# Patient Record
Sex: Male | Born: 1990 | Race: Black or African American | Hispanic: No | Marital: Single | Smoking: Current every day smoker
Health system: Southern US, Community
[De-identification: ages and names within clinical notes are randomized; demographics above are authoritative.]

## PROBLEM LIST (undated history)

## (undated) DIAGNOSIS — J45909 Unspecified asthma, uncomplicated: Secondary | ICD-10-CM

## (undated) HISTORY — PX: THUMB FUSION: SUR636

---

## 2015-02-08 ENCOUNTER — Emergency Department (HOSPITAL_COMMUNITY)
Admission: EM | Admit: 2015-02-08 | Discharge: 2015-02-09 | Disposition: A | Discharge status: Home / Self Care | Payer: Self-pay | Attending: Emergency Medicine | Admitting: Emergency Medicine

## 2015-02-08 ENCOUNTER — Encounter (HOSPITAL_COMMUNITY): Payer: Self-pay | Admitting: Emergency Medicine

## 2015-02-08 ENCOUNTER — Emergency Department (HOSPITAL_COMMUNITY): Payer: Self-pay

## 2015-02-08 DIAGNOSIS — M79672 Pain in left foot: Secondary | ICD-10-CM

## 2015-02-08 DIAGNOSIS — S93602A Unspecified sprain of left foot, initial encounter: Secondary | ICD-10-CM | POA: Insufficient documentation

## 2015-02-08 DIAGNOSIS — Y998 Other external cause status: Secondary | ICD-10-CM | POA: Insufficient documentation

## 2015-02-08 DIAGNOSIS — J45909 Unspecified asthma, uncomplicated: Secondary | ICD-10-CM | POA: Insufficient documentation

## 2015-02-08 DIAGNOSIS — Y92838 Other recreation area as the place of occurrence of the external cause: Secondary | ICD-10-CM | POA: Insufficient documentation

## 2015-02-08 DIAGNOSIS — W500XXA Accidental hit or strike by another person, initial encounter: Secondary | ICD-10-CM | POA: Insufficient documentation

## 2015-02-08 DIAGNOSIS — Y9367 Activity, basketball: Secondary | ICD-10-CM | POA: Insufficient documentation

## 2015-02-08 HISTORY — DX: Unspecified asthma, uncomplicated: J45.909

## 2015-02-08 MED ORDER — TRAMADOL HCL 50 MG PO TABS
50.0000 mg | ORAL_TABLET | Freq: Once | ORAL | Status: AC
  Administered 2015-02-08: 50 mg via ORAL
  Filled 2015-02-08: qty 1

## 2015-02-08 NOTE — ED Notes (Signed)
Pt states a few months ago he injured his ankle playing basketball and wasn't seen for it. States today he was playing basketball and re-injured the area. Mild swelling with darker coloration to a small area of skin located on the inner aspect of patient's ankle observed. No obvious deformities

## 2015-02-08 NOTE — ED Provider Notes (Signed)
CSN: [id number]     Arrival date & time 02/08/15  2129 History  This chart was scribed for Antony MaduraKelly Jenniferann Stuckert, PA-C working with Layla MawKristen N Ward, DO by Elveria Risingimelie Horne, ED Scribe. This patient was seen in room WTR8/WTR8 and the patient's care was started at 10:43 PM.   No chief complaint on file.  The history is provided by the patient. No language interpreter was used.   HPI Comments: Joshua Hartman is a 24 y.o. male who presents to the Emergency Department after left ankle injury incurred this afternoon while playing basketball. Patient reports inverting his ankle after being being hit by another player. Patient reports throbbing pain to the medial aspect of foot/arch especially with ambulation but reports the pain has improved since the injury this afternoon. Patient however reports limping due to pain severity. No pain medication at home. Patient shares a left ankle injury 3-4 months ago via similar mechanism. Patient reports pain following the injuring that had resolved prior to reinjury today. Patient reports treating this ankle with ankle braces and bandaging.  Patient reports history of previous injuries to ankle including fracture and and tendonitis.   Past Medical History  Diagnosis Date  . Asthma    Past Surgical History  Procedure Laterality Date  . Thumb fusion     History reviewed. No pertinent family history. History  Substance Use Topics  . Smoking status: Not on file  . Smokeless tobacco: Not on file  . Alcohol Use: Not on file    Review of Systems  Constitutional: Negative for fever.  Musculoskeletal:       Left foot pain  Skin: Negative for color change and wound.  Neurological: Negative for weakness and numbness.  All other systems reviewed and are negative.   Allergies  Augmentin  Home Medications   Prior to Admission medications   Medication Sig Start Date End Date Taking? Authorizing Provider  meloxicam (MOBIC) 7.5 MG tablet Take 2 tablets (15 mg total) by mouth  daily. 02/09/15   Antony MaduraKelly Seanne Chirico, PA-C   Triage Vitals: BP 111/72 mmHg  Pulse 99  Temp(Src) 98.6 F (37 C) (Oral)  Resp 18  SpO2 100%  Physical Exam  Constitutional: He is oriented to person, place, and time. He appears well-developed and well-nourished. No distress.  HENT:  Head: Normocephalic and atraumatic.  Eyes: Conjunctivae and EOM are normal. No scleral icterus.  Neck: Normal range of motion.  Cardiovascular: Normal rate, regular rhythm and intact distal pulses.   DP and PT pulses 2+ in the left lower extremity  Pulmonary/Chest: Effort normal. No respiratory distress.  Musculoskeletal: Normal range of motion. He exhibits tenderness.       Left foot: There is tenderness. There is normal range of motion, no swelling, normal capillary refill, no crepitus and no deformity.       Feet:  Neurological: He is alert and oriented to person, place, and time. He exhibits normal muscle tone. Coordination normal.  Sensation to light touch intact. Patient able to wiggle all toes.  Skin: Skin is warm and dry. No rash noted. He is not diaphoretic. No erythema. No pallor.  Psychiatric: He has a normal mood and affect. His behavior is normal.  Nursing note and vitals reviewed.   ED Course  Procedures (including critical care time)  COORDINATION OF CARE: 10:51 PM- Discussed treatment plan with patient at bedside and patient agreed to plan.   Labs Review Labs Reviewed - No data to display  Imaging Review Dg Ankle Complete Left  02/08/2015   CLINICAL DATA:  Fall with ankle injury.  Initial encounter.  EXAM: LEFT ANKLE COMPLETE - 3+ VIEW  COMPARISON:  None.  FINDINGS: There is no evidence of fracture, dislocation, or joint effusion.  There is a lucency within the navicular bone which is better evaluated on dedicated foot imaging.  IMPRESSION: 1. No acute osseous findings. 2.  Navicular lucency, see foot radiography report.   Electronically Signed   By: Marnee Spring M.D.   On: 02/08/2015 23:04    Dg Foot Complete Left  02/08/2015   CLINICAL DATA:  Foot injury playing basketball, now with lateral sided ankle and foot pain.  EXAM: LEFT FOOT - COMPLETE 3+ VIEW  COMPARISON:  Left ankle radiographs-earlier same day  FINDINGS: No fracture or dislocation. Pes planus deformity is suspected on this nonweightbearing radiograph. Joint spaces are preserved. No erosions. Apparent lucency within the navicular bone may represent an intra osseous lipoma. No associated periostitis. Regional soft tissues appear normal. No radiopaque foreign body.  IMPRESSION: No acute findings.   Electronically Signed   By: Simonne Come M.D.   On: 02/08/2015 23:35     EKG Interpretation None      MDM   Final diagnoses:  Foot pain, left  Foot sprain, left, initial encounter    24 year old male presents to the emergency department for further evaluation of left foot pain. He states that he injured his foot while playing basketball. He is neurovascularly intact. Tenderness noted to the medial arch of the left foot. No crepitus or deformity. X-ray negative for fracture, dislocation, or bony deformity. Will manage with ASO ankle and crutches for WBAT. RICE and NSAIDs advised and return precautions given. Patient also provided referral to orthopedics should he desire outpatient follow-up. Patient agreeable to plan with no unaddressed concerns. Patient discharged in good condition.  I personally performed the services described in this documentation, which was scribed in my presence. The recorded information has been reviewed and is accurate.   Filed Vitals:   02/08/15 2221 02/09/15 0037  BP: 111/72 127/82  Pulse: 99 85  Temp: 98.6 F (37 C)   TempSrc: Oral   Resp: 18 18  SpO2: 100% 100%      Antony Madura, PA-C 02/09/15 0052  Layla Maw Ward, DO 02/09/15 1610

## 2015-02-09 MED ORDER — MELOXICAM 7.5 MG PO TABS: 15.0000 mg | ORAL_TABLET | Freq: Every day | ORAL | Status: DC

## 2015-02-09 NOTE — Discharge Instructions (Signed)
Foot Sprain The muscles and cord like structures which attach muscle to bone (tendons) that surround the feet are made up of units. A foot sprain can occur at the weakest spot in any of these units. This condition is most often caused by injury to or overuse of the foot, as from playing contact sports, or aggravating a previous injury, or from poor conditioning, or obesity. SYMPTOMS  Pain with movement of the foot.  Tenderness and swelling at the injury site.  Loss of strength is present in moderate or severe sprains. THE THREE GRADES OR SEVERITY OF FOOT SPRAIN ARE:  Mild (Grade I): Slightly pulled muscle without tearing of muscle or tendon fibers or loss of strength.  Moderate (Grade II): Tearing of fibers in a muscle, tendon, or at the attachment to bone, with small decrease in strength.  Severe (Grade III): Rupture of the muscle-tendon-bone attachment, with separation of fibers. Severe sprain requires surgical repair. Often repeating (chronic) sprains are caused by overuse. Sudden (acute) sprains are caused by direct injury or over-use. DIAGNOSIS  Diagnosis of this condition is usually by your own observation. If problems continue, a caregiver may be required for further evaluation and treatment. X-rays may be required to make sure there are not breaks in the bones (fractures) present. Continued problems may require physical therapy for treatment. PREVENTION  Use strength and conditioning exercises appropriate for your sport.  Warm up properly prior to working out.  Use athletic shoes that are made for the sport you are participating in.  Allow adequate time for healing. Early return to activities makes repeat injury more likely, and can lead to an unstable arthritic foot that can result in prolonged disability. Mild sprains generally heal in 3 to 10 days, with moderate and severe sprains taking 2 to 10 weeks. Your caregiver can help you determine the proper time required for  healing. HOME CARE INSTRUCTIONS   Apply ice to the injury for 15-20 minutes, 03-04 times per day. Put the ice in a plastic bag and place a towel between the bag of ice and your skin.  An elastic wrap (like an Ace bandage) may be used to keep swelling down.  Keep foot above the level of the heart, or at least raised on a footstool, when swelling and pain are present.  Try to avoid use other than gentle range of motion while the foot is painful. Do not resume use until instructed by your caregiver. Then begin use gradually, not increasing use to the point of pain. If pain does develop, decrease use and continue the above measures, gradually increasing activities that do not cause discomfort, until you gradually achieve normal use.  Use crutches if and as instructed, and for the length of time instructed.  Keep injured foot and ankle wrapped between treatments.  Massage foot and ankle for comfort and to keep swelling down. Massage from the toes up towards the knee.  Only take over-the-counter or prescription medicines for pain, discomfort, or fever as directed by your caregiver. SEEK IMMEDIATE MEDICAL CARE IF:   Your pain and swelling increase, or pain is not controlled with medications.  You have loss of feeling in your foot or your foot turns cold or blue.  You develop new, unexplained symptoms, or an increase of the symptoms that brought you to your caregiver. MAKE SURE YOU:   Understand these instructions.  Will watch your condition.  Will get help right away if you are not doing well or get worse. Document Released:   02/21/2002 Document Revised: 11/24/2011 Document Reviewed: 04/20/2008 ExitCare Patient Information 2015 [employer]. This information is not intended to replace advice given to you by your health care provider. Make sure you discuss any questions you have with your health care provider.  RICE: Routine Care for Injuries The routine care of many injuries includes  Rest, Ice, Compression, and Elevation (RICE). HOME CARE INSTRUCTIONS  Rest is needed to allow your body to heal. Routine activities can usually be resumed when comfortable. Injured tendons and bones can take up to 6 weeks to heal. Tendons are the cord-like structures that attach muscle to bone.  Ice following an injury helps keep the swelling down and reduces pain.  Put ice in a plastic bag.  Place a towel between your skin and the bag.  Leave the ice on for 15-20 minutes, 3-4 times a day, or as directed by your health care provider. Do this while awake, for the first 24 to 48 hours. After that, continue as directed by your caregiver.  Compression helps keep swelling down. It also gives support and helps with discomfort. If an elastic bandage has been applied, it should be removed and reapplied every 3 to 4 hours. It should not be applied tightly, but firmly enough to keep swelling down. Watch fingers or toes for swelling, bluish discoloration, coldness, numbness, or excessive pain. If any of these problems occur, remove the bandage and reapply loosely. Contact your caregiver if these problems continue.  Elevation helps reduce swelling and decreases pain. With extremities, such as the arms, hands, legs, and feet, the injured area should be placed near or above the level of the heart, if possible. SEEK IMMEDIATE MEDICAL CARE IF:  You have persistent pain and swelling.  You develop redness, numbness, or unexpected weakness.  Your symptoms are getting worse rather than improving after several days. These symptoms may indicate that further evaluation or further X-rays are needed. Sometimes, X-rays may not show a small broken bone (fracture) until 1 week or 10 days later. Make a follow-up appointment with your caregiver. Ask when your X-ray results will be ready. Make sure you get your X-ray results. Document Released: 12/14/2000 Document Revised: 09/06/2013 Document Reviewed:  01/31/2011 ExitCare Patient Information 2015 [employer]. This information is not intended to replace advice given to you by your health care provider. Make sure you discuss any questions you have with your health care provider.  

## 2015-11-03 ENCOUNTER — Emergency Department (HOSPITAL_COMMUNITY)
Admission: EM | Admit: 2015-11-03 | Discharge: 2015-11-03 | Disposition: A | Discharge status: Home / Self Care | Payer: Self-pay | Attending: Emergency Medicine | Admitting: Emergency Medicine

## 2015-11-03 ENCOUNTER — Encounter (HOSPITAL_COMMUNITY): Payer: Self-pay | Admitting: *Deleted

## 2015-11-03 DIAGNOSIS — F17210 Nicotine dependence, cigarettes, uncomplicated: Secondary | ICD-10-CM | POA: Insufficient documentation

## 2015-11-03 DIAGNOSIS — J45909 Unspecified asthma, uncomplicated: Secondary | ICD-10-CM | POA: Insufficient documentation

## 2015-11-03 DIAGNOSIS — Z791 Long term (current) use of non-steroidal anti-inflammatories (NSAID): Secondary | ICD-10-CM | POA: Insufficient documentation

## 2015-11-03 DIAGNOSIS — Z202 Contact with and (suspected) exposure to infections with a predominantly sexual mode of transmission: Secondary | ICD-10-CM | POA: Insufficient documentation

## 2015-11-03 MED ORDER — LIDOCAINE HCL (PF) 1 % IJ SOLN
0.9000 mL | Freq: Once | INTRAMUSCULAR | Status: AC
  Administered 2015-11-03: 0.9 mL
  Filled 2015-11-03: qty 5

## 2015-11-03 MED ORDER — AZITHROMYCIN 250 MG PO TABS
1000.0000 mg | ORAL_TABLET | Freq: Once | ORAL | Status: AC
  Administered 2015-11-03: 1000 mg via ORAL
  Filled 2015-11-03: qty 4

## 2015-11-03 MED ORDER — CEFTRIAXONE SODIUM 250 MG IJ SOLR
250.0000 mg | INTRAMUSCULAR | Status: DC
  Administered 2015-11-03: 250 mg via INTRAMUSCULAR
  Filled 2015-11-03: qty 250

## 2015-11-03 NOTE — ED Provider Notes (Signed)
CSN: [id number]     Arrival date & time 11/03/15  1402 History   First MD Initiated Contact with Patient 11/03/15 1522     Chief Complaint  Patient presents with  . Exposure to STD   HPI   25 year old male presents today with STD exposure. Patient reports that his sexual partner was recently diagnosed with chlamydia. Patient denies any penile discharge, swelling, rash, pain to the testicles, abdominal pain, nausea, vomiting, fever. Patient has no other complaints other than potential exposure to STD.  Past Medical History  Diagnosis Date  . Asthma    Past Surgical History  Procedure Laterality Date  . Thumb fusion     History reviewed. No pertinent family history. Social History  Substance Use Topics  . Smoking status: Current Every Day Smoker    Types: Cigarettes  . Smokeless tobacco: None  . Alcohol Use: No    Review of Systems  All other systems reviewed and are negative.   Allergies  Augmentin  Home Medications   Prior to Admission medications   Medication Sig Start Date End Date Taking? Authorizing Provider  meloxicam (MOBIC) 7.5 MG tablet Take 2 tablets (15 mg total) by mouth daily. 02/09/15   Antony Madura, PA-C   BP 135/78 mmHg  Pulse 75  Temp(Src) 98.1 F (36.7 C) (Oral)  Resp 18  Wt 63.504 kg  SpO2 99%   Physical Exam  Constitutional: He is oriented to person, place, and time. He appears well-developed and well-nourished.  HENT:  Head: Normocephalic and atraumatic.  Eyes: Conjunctivae are normal. Pupils are equal, round, and reactive to light. Right eye exhibits no discharge. Left eye exhibits no discharge. No scleral icterus.  Neck: Normal range of motion. No JVD present. No tracheal deviation present.  Pulmonary/Chest: Effort normal. No stridor.  Neurological: He is alert and oriented to person, place, and time. Coordination normal.  Psychiatric: He has a normal mood and affect. His behavior is normal. Judgment and thought content normal.  Nursing  note and vitals reviewed.   ED Course  Procedures (including critical care time) Labs Review Labs Reviewed  RPR  HIV ANTIBODY (ROUTINE TESTING)  GC/CHLAMYDIA PROBE AMP ([place]) NOT AT [facility]    Imaging Review No results found. I have personally reviewed and evaluated these images and lab results as part of my medical decision-making.   EKG Interpretation None      MDM   Final diagnoses:  STD exposure    Labs: HIV, RPR, gonorrhea, chlamydia  Imaging:  Consults:  Therapeutics: Zithromax), ceftriaxone  Discharge Meds:   Assessment/Plan: 24 year old male with potential STD exposure. Asymptomatic, requesting prophylactic treatment and patient treated with above medications, given return precautions. Patient verbalized understanding and agreement to this plan.        Eyvonne Mechanic, PA-C 11/03/15 [address] PA-C 11/04/15 0045  Bethann Berkshire, MD 11/04/15 [phone]

## 2015-11-03 NOTE — ED Notes (Signed)
Pt reports being exposed to std and wants to be checked, denies any symptoms.

## 2015-11-04 LAB — HIV ANTIBODY (ROUTINE TESTING W REFLEX): HIV Screen 4th Generation wRfx: NONREACTIVE

## 2015-11-05 LAB — RPR, QUANT+TP ABS (REFLEX)
Rapid Plasma Reagin, Quant: 1:32 {titer} — ABNORMAL HIGH
T Pallidum Abs: POSITIVE — AB

## 2015-11-05 LAB — GC/CHLAMYDIA PROBE AMP (~~LOC~~) NOT AT ARMC
Chlamydia: POSITIVE — AB
Neisseria Gonorrhea: NEGATIVE

## 2015-11-05 LAB — RPR: RPR Ser Ql: REACTIVE — AB

## 2015-11-06 ENCOUNTER — Telehealth (HOSPITAL_BASED_OUTPATIENT_CLINIC_OR_DEPARTMENT_OTHER): Payer: Self-pay | Admitting: Emergency Medicine

## 2015-11-06 NOTE — Telephone Encounter (Signed)
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 

## 2015-11-10 ENCOUNTER — Telehealth (HOSPITAL_COMMUNITY): Payer: Self-pay

## 2015-11-10 NOTE — Telephone Encounter (Signed)
Chart reviewed by Dr Melene Plan "Return to ED for Benzathine Penicillin G 2.4 million units IM x 1"  11/10/2015 @ 18:37 LVM requesting callback

## 2015-11-11 ENCOUNTER — Telehealth (HOSPITAL_COMMUNITY): Payer: Self-pay

## 2015-11-11 NOTE — Telephone Encounter (Signed)
Letter sent to [employer] address.  [employer] form faxed 

## 2016-06-21 IMAGING — CR DG ANKLE COMPLETE 3+V*L*
3 series · 3 of 3 positions shown · non-contrast
Comparison: None.

CLINICAL DATA: Fall with ankle injury.  Initial encounter.

EXAM:
LEFT ANKLE COMPLETE - 3+ VIEW

[x ankle ap left]
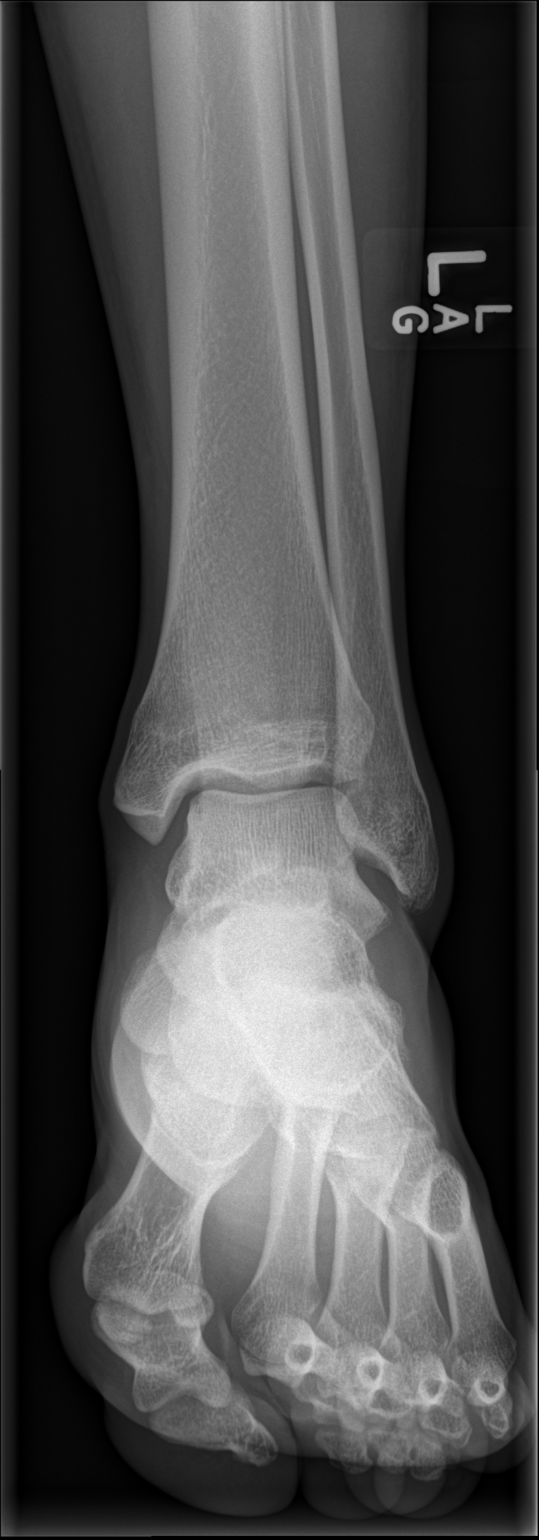

[x ankle obl left]
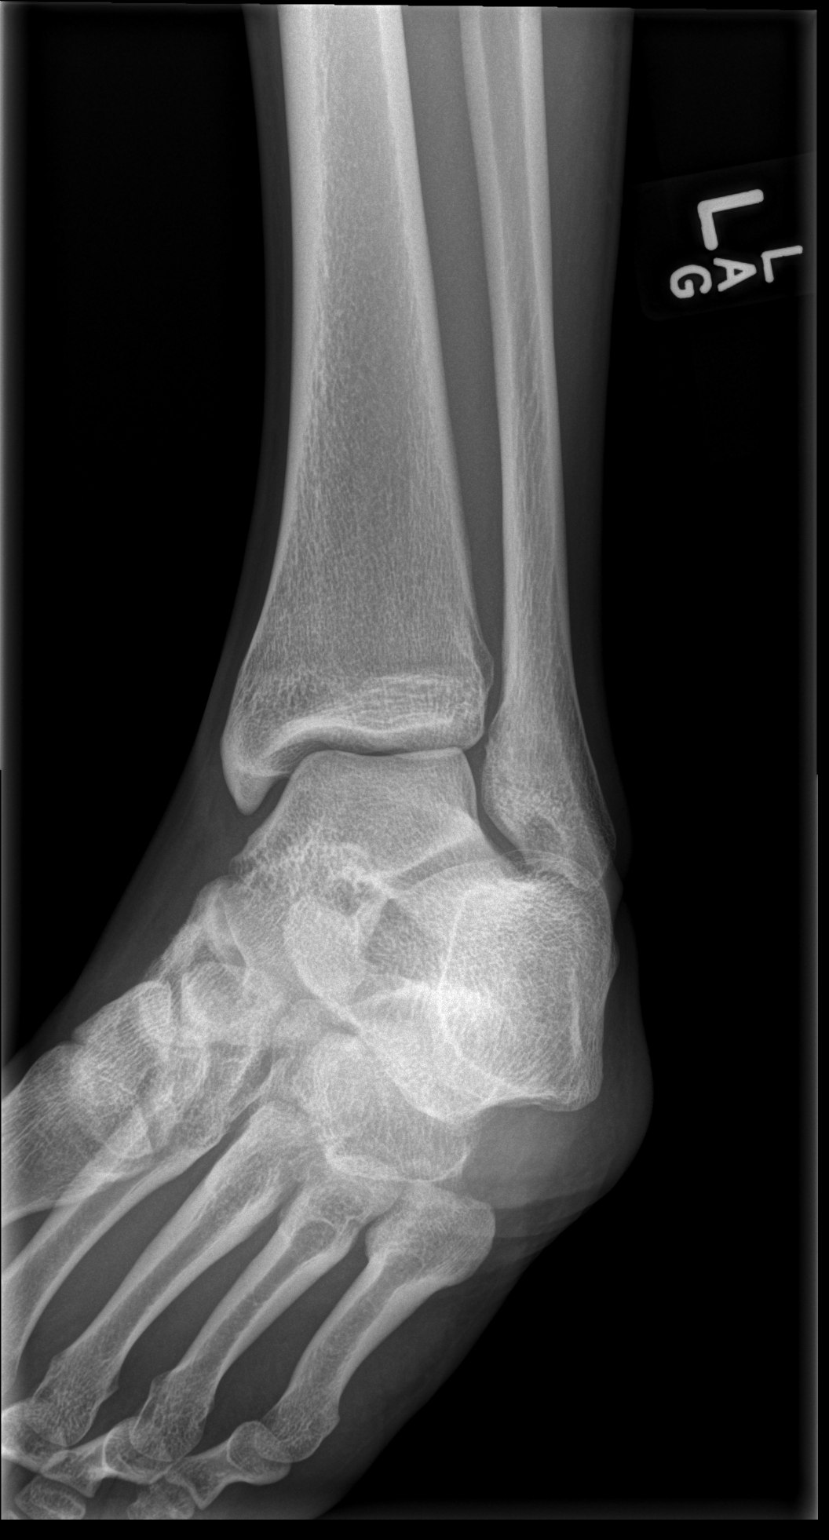

[x ankle lat left]
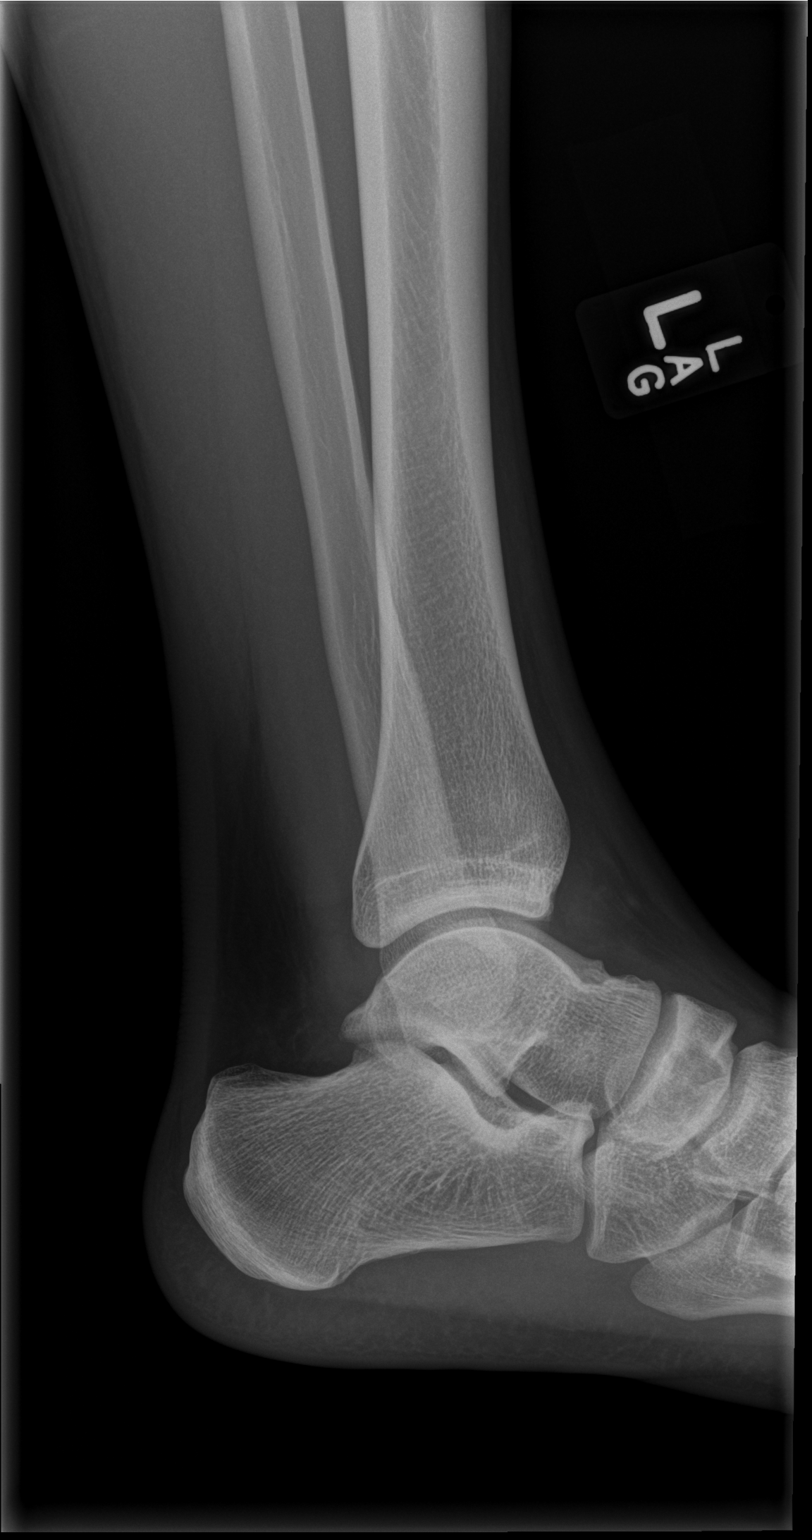

[3 of 3 positions shown; findings below may reference images not displayed]

FINDINGS: There is no evidence of fracture, dislocation, or joint effusion.

There is a lucency within the navicular bone which is better
evaluated on dedicated foot imaging.
IMPRESSION: 1. No acute osseous findings.
2.  Navicular lucency, see foot radiography report.

## 2016-08-26 ENCOUNTER — Emergency Department (HOSPITAL_COMMUNITY)
Admission: EM | Admit: 2016-08-26 | Discharge: 2016-08-26 | Disposition: A | Discharge status: Home / Self Care | Payer: Self-pay | Attending: Emergency Medicine | Admitting: Emergency Medicine

## 2016-08-26 ENCOUNTER — Encounter (HOSPITAL_COMMUNITY): Payer: Self-pay | Admitting: *Deleted

## 2016-08-26 DIAGNOSIS — J45909 Unspecified asthma, uncomplicated: Secondary | ICD-10-CM | POA: Insufficient documentation

## 2016-08-26 DIAGNOSIS — R112 Nausea with vomiting, unspecified: Secondary | ICD-10-CM | POA: Insufficient documentation

## 2016-08-26 DIAGNOSIS — F17210 Nicotine dependence, cigarettes, uncomplicated: Secondary | ICD-10-CM | POA: Insufficient documentation

## 2016-08-26 DIAGNOSIS — R197 Diarrhea, unspecified: Secondary | ICD-10-CM | POA: Insufficient documentation

## 2016-08-26 LAB — COMPREHENSIVE METABOLIC PANEL
ALT: 11 U/L — AB (ref 17–63)
AST: 25 U/L (ref 15–41)
Albumin: 4.1 g/dL (ref 3.5–5.0)
Alkaline Phosphatase: 71 U/L (ref 38–126)
Anion gap: 10 (ref 5–15)
BILIRUBIN TOTAL: 0.7 mg/dL (ref 0.3–1.2)
BUN: 16 mg/dL (ref 6–20)
CO2: 25 mmol/L (ref 22–32)
CREATININE: 3.34 mg/dL — AB (ref 0.61–1.24)
Calcium: 8 mg/dL — ABNORMAL LOW (ref 8.9–10.3)
Chloride: 106 mmol/L (ref 101–111)
GFR calc Af Amer: 28 mL/min — ABNORMAL LOW (ref >60)
GFR, EST NON AFRICAN AMERICAN: 24 mL/min — AB (ref >60)
Glucose, Bld: 95 mg/dL (ref 65–99)
Potassium: 3.8 mmol/L (ref 3.5–5.1)
Sodium: 141 mmol/L (ref 135–145)
TOTAL PROTEIN: 6.5 g/dL (ref 6.5–8.1)

## 2016-08-26 LAB — CBC
HCT: 34.6 % — ABNORMAL LOW (ref 39.0–52.0)
Hemoglobin: 11.6 g/dL — ABNORMAL LOW (ref 13.0–17.0)
MCH: 31.6 pg (ref 26.0–34.0)
MCHC: 33.5 g/dL (ref 30.0–36.0)
MCV: 94.3 fL (ref 78.0–100.0)
PLATELETS: 206 10*3/uL (ref 150–400)
RBC: 3.67 MIL/uL — ABNORMAL LOW (ref 4.22–5.81)
RDW: 12.4 % (ref 11.5–15.5)
WBC: 7.9 10*3/uL (ref 4.0–10.5)

## 2016-08-26 LAB — LIPASE, BLOOD: Lipase: 62 U/L — ABNORMAL HIGH (ref 11–51)

## 2016-08-26 MED ORDER — ONDANSETRON HCL 4 MG/2ML IJ SOLN: 4.0000 mg | Freq: Once | INTRAMUSCULAR | Status: DC

## 2016-08-26 MED ORDER — ONDANSETRON 4 MG PO TBDP: ORAL_TABLET | ORAL | 0 refills | Status: DC

## 2016-08-26 MED ORDER — SODIUM CHLORIDE 0.9 % IV BOLUS (SEPSIS): 1000.0000 mL | Freq: Once | INTRAVENOUS | Status: DC

## 2016-08-26 MED ORDER — ONDANSETRON 4 MG PO TBDP
4.0000 mg | ORAL_TABLET | Freq: Once | ORAL | Status: AC
  Administered 2016-08-26: 4 mg via ORAL
  Filled 2016-08-26: qty 1

## 2016-08-26 NOTE — ED Provider Notes (Signed)
[facility]-EMERGENCY DEPT Provider Note   CSN: [id number] Arrival date & time: 08/26/16  1316     History   Chief Complaint Chief Complaint  Patient presents with  . Emesis    HPI Joshua Hartman is a 25 y.o. male.  Patient complains of vomiting and diarrhea for 1 day. No abdominal pain. No blood in his vomit  or diarrhea   The history is provided by the patient. No language interpreter was used.  Emesis   This is a new problem. The current episode started 12 to 24 hours ago. The problem occurs 5 to 10 times per day. The problem has been resolved. The emesis has an appearance of stomach contents. There has been no fever. Associated symptoms include diarrhea. Pertinent negatives include no abdominal pain, no chills, no cough and no headaches.    Past Medical History:  Diagnosis Date  . Asthma     There are no active problems to display for this patient.   Past Surgical History:  Procedure Laterality Date  . THUMB FUSION         Home Medications    Prior to Admission medications   Medication Sig Start Date End Date Taking? Authorizing Provider  meloxicam (MOBIC) 7.5 MG tablet Take 2 tablets (15 mg total) by mouth daily. 02/09/15   Antony MaduraKelly Humes, PA-C  ondansetron (ZOFRAN ODT) 4 MG disintegrating tablet 4mg  ODT q4 hours prn nausea/vomit 08/26/16   Bethann BerkshireJoseph Kersten Salmons, MD    Family History History reviewed. No pertinent family history.  Social History Social History  Substance Use Topics  . Smoking status: Current Every Day Smoker    Types: Cigarettes  . Smokeless tobacco: Not on file  . Alcohol use No     Allergies   Augmentin [amoxicillin-pot clavulanate]   Review of Systems Review of Systems  Constitutional: Negative for appetite change, chills and fatigue.  HENT: Negative for congestion, ear discharge and sinus pressure.   Eyes: Negative for discharge.  Respiratory: Negative for cough.   Cardiovascular: Negative for chest pain.  Gastrointestinal: Positive for  diarrhea and vomiting. Negative for abdominal pain.  Genitourinary: Negative for frequency and hematuria.  Musculoskeletal: Negative for back pain.  Skin: Negative for rash.  Neurological: Negative for seizures and headaches.  Psychiatric/Behavioral: Negative for hallucinations.     Physical Exam Updated Vital Signs BP 128/57 (BP Location: Right Arm)   Pulse 66   Temp 98 F (36.7 C) (Oral)   Resp 16   Ht 5\' 6"  (1.676 m)   Wt 145 lb (65.8 kg)   SpO2 99%   BMI 23.40 kg/m   Physical Exam  Constitutional: He is oriented to person, place, and time. He appears well-developed.  HENT:  Head: Normocephalic.  Eyes: Conjunctivae and EOM are normal. No scleral icterus.  Neck: Neck supple. No thyromegaly present.  Cardiovascular: Normal rate and regular rhythm.  Exam reveals no gallop and no friction rub.   No murmur heard. Pulmonary/Chest: No stridor. He has no wheezes. He has no rales. He exhibits no tenderness.  Abdominal: He exhibits no distension. There is no tenderness. There is no rebound.  Musculoskeletal: Normal range of motion. He exhibits no edema.  Lymphadenopathy:    He has no cervical adenopathy.  Neurological: He is oriented to person, place, and time. He exhibits normal muscle tone. Coordination normal.  Skin: No rash noted. No erythema.  Psychiatric: He has a normal mood and affect. His behavior is normal.     ED Treatments / Results  Labs (all labs ordered are listed, but only abnormal results are displayed) Labs Reviewed  LIPASE, BLOOD - Abnormal; Notable for the following:       Result Value   Lipase 62 (*)    All other components within normal limits  COMPREHENSIVE METABOLIC PANEL - Abnormal; Notable for the following:    Creatinine, Ser 3.34 (*)    Calcium 8.0 (*)    ALT 11 (*)    GFR calc non Af Amer 24 (*)    GFR calc Af Amer 28 (*)    All other components within normal limits  CBC - Abnormal; Notable for the following:    RBC 3.67 (*)     Hemoglobin 11.6 (*)    HCT 34.6 (*)    All other components within normal limits  URINALYSIS, ROUTINE W REFLEX MICROSCOPIC    EKG  EKG Interpretation None       Radiology No results found.  Procedures Procedures (including critical care time)  Medications Ordered in ED Medications  ondansetron (ZOFRAN-ODT) disintegrating tablet 4 mg (4 mg Oral Given 08/26/16 1947)     Initial Impression / Assessment and Plan / ED Course  I have reviewed the triage vital signs and the nursing notes.  Pertinent labs & imaging results that were available during my care of the patient were reviewed by me and considered in my medical decision making (see chart for details).  Clinical Course     Gastroenteritis. Patient will be treated with Zofran drink plenty of fluids and follow-up with PCP as needed  Final Clinical Impressions(s) / ED Diagnoses   Final diagnoses:  Nausea vomiting and diarrhea    New Prescriptions New Prescriptions   ONDANSETRON (ZOFRAN ODT) 4 MG DISINTEGRATING TABLET    4mg  ODT q4 hours prn nausea/vomit     Bethann BerkshireJoseph Geneieve Duell, MD 08/26/16 1950

## 2016-08-26 NOTE — ED Triage Notes (Signed)
Pt reports n/v this am, denies diarrhea. Also has recent cough. No acute distress noted at triage.

## 2016-08-26 NOTE — Discharge Instructions (Signed)
Drink plenty of fluids. Take the nausea medicine if needed. Get rechecked in 2-3 days if not improving

## 2019-09-20 ENCOUNTER — Encounter (HOSPITAL_COMMUNITY): Payer: Self-pay | Admitting: Emergency Medicine

## 2019-09-20 ENCOUNTER — Other Ambulatory Visit: Payer: Self-pay

## 2019-09-20 ENCOUNTER — Emergency Department (HOSPITAL_COMMUNITY)
Admission: EM | Admit: 2019-09-20 | Discharge: 2019-09-20 | Disposition: A | Discharge status: Home / Self Care | Payer: Self-pay | Attending: Emergency Medicine | Admitting: Emergency Medicine

## 2019-09-20 DIAGNOSIS — F17210 Nicotine dependence, cigarettes, uncomplicated: Secondary | ICD-10-CM | POA: Insufficient documentation

## 2019-09-20 DIAGNOSIS — K0889 Other specified disorders of teeth and supporting structures: Secondary | ICD-10-CM | POA: Insufficient documentation

## 2019-09-20 DIAGNOSIS — K029 Dental caries, unspecified: Secondary | ICD-10-CM | POA: Insufficient documentation

## 2019-09-20 MED ORDER — CLINDAMYCIN HCL 150 MG PO CAPS: 300.0000 mg | ORAL_CAPSULE | Freq: Three times a day (TID) | ORAL | 0 refills | Status: AC

## 2019-09-20 MED ORDER — NAPROXEN 500 MG PO TABS: 500.0000 mg | ORAL_TABLET | Freq: Two times a day (BID) | ORAL | 0 refills | Status: DC

## 2019-09-20 NOTE — ED Provider Notes (Signed)
[facility] EMERGENCY DEPARTMENT Provider Note   CSN: [id number] Arrival date & time: 09/20/19  1438     History Chief Complaint  Patient presents with  . Dental Pain    Joshua Hartman is a 29 y.o. male with a hx of tobacco abuse & asthma who presents to the ED with complaints of left upper dental pain for the past 5 days. Pain is constant, no alleviating/aggravating factors, taking tylenol w/o relief. He states he has a damaged tooth to this location that gives him problems. Does not see a dentist, waiting on medicaid to go through. Denies fever, chills,dysphagia, pain/swelling beneath the tongue, facial swelling, or dyspnea.   HPI     Past Medical History:  Diagnosis Date  . Asthma     There are no problems to display for this patient.   Past Surgical History:  Procedure Laterality Date  . THUMB FUSION         No family history on file.  Social History   Tobacco Use  . Smoking status: Current Every Day Smoker    Types: Cigarettes  Substance Use Topics  . Alcohol use: No  . Drug use: No    Home Medications Prior to Admission medications   Medication Sig Start Date End Date Taking? Authorizing Provider  meloxicam (MOBIC) 7.5 MG tablet Take 2 tablets (15 mg total) by mouth daily. 02/09/15   Antony Madura, PA-C  ondansetron (ZOFRAN ODT) 4 MG disintegrating tablet 4mg  ODT q4 hours prn nausea/vomit 08/26/16   14/12/17, MD    Allergies    Augmentin [amoxicillin-pot clavulanate]  Review of Systems   Review of Systems  Constitutional: Negative for chills and fever.  HENT: Positive for dental problem. Negative for sore throat, trouble swallowing and voice change.   Eyes: Negative for visual disturbance.  Respiratory: Negative for shortness of breath.   Cardiovascular: Negative for chest pain.  Gastrointestinal: Negative for vomiting.  Musculoskeletal: Negative for neck pain.    Physical Exam Updated Vital Signs BP (!) 146/98 (BP  Location: Right Arm)   Pulse 75   Temp 98.2 F (36.8 C) (Oral)   Resp (!) 22   SpO2 100%   Physical Exam Vitals and nursing note reviewed.  Constitutional:      General: He is not in acute distress.    Appearance: He is well-developed. He is not toxic-appearing.  HENT:     Head: Normocephalic and atraumatic.     Right Ear: Tympanic membrane is not perforated, erythematous, retracted or bulging.     Left Ear: Tympanic membrane is not perforated, erythematous, retracted or bulging.     Nose: Nose normal.     Mouth/Throat:     Pharynx: Uvula midline. No oropharyngeal exudate or posterior oropharyngeal erythema.      Comments: Posterior oropharynx is symmetric appearing. Patient tolerating own secretions without difficulty. No trismus. No drooling. No hot potato voice. No swelling beneath the tongue, submandibular compartment is soft.  Eyes:     General:        Right eye: No discharge.        Left eye: No discharge.     Conjunctiva/sclera: Conjunctivae normal.  Musculoskeletal:     Cervical back: Normal range of motion and neck supple. No rigidity, tenderness or crepitus. No pain with movement.  Lymphadenopathy:     Cervical: No cervical adenopathy.  Neurological:     Mental Status: He is alert.  Psychiatric:        Behavior:  Behavior normal.        Thought Content: Thought content normal.     ED Results / Procedures / Treatments   Labs (all labs ordered are listed, but only abnormal results are displayed) Labs Reviewed - No data to display  EKG None  Radiology No results found.  Procedures Procedures (including critical care time)  Medications Ordered in ED Medications - No data to display  ED Course  I have reviewed the triage vital signs and the nursing notes.  Pertinent labs & imaging results that were available during my care of the patient were reviewed by me and considered in my medical decision making (see chart for details).    MDM  Rules/Calculators/A&P                      Patient presents with dental pain. Patient is nontoxic appearing, vitals without significant abnormality. No gross abscess.  Exam unconcerning for Ludwig's angina or deep space infection.  Will treat with Clindamycin (PCN allergy) and Naproxen.  Urged patient to follow-up with dentist, dental resources were provided.  Discussed treatment plan and need for follow up as well as return precautions. Provided opportunity for questions, patient confirmed understanding and is agreeable to plan.  Final Clinical Impression(s) / ED Diagnoses Final diagnoses:  Pain, dental    Rx / DC Orders ED Discharge Orders         Ordered    naproxen (NAPROSYN) 500 MG tablet  2 times daily     09/20/19 1525    clindamycin (CLEOCIN) 150 MG capsule  3 times daily     09/20/19 [address], [place], PA-C 09/20/19 1527    Carmin Muskrat, MD 09/21/19 [phone]

## 2019-09-20 NOTE — Discharge Instructions (Addendum)
Call one of the dentists offices provided to schedule an appointment for re-evaluation and further management within the next 48 hours.   I have prescribed you Clindamycin which is an antibiotic to treat the infection and Naproxen which is an anti-inflammatory medicine to treat the pain.   Please take all of your antibiotics until finished. You may develop abdominal discomfort or diarrhea from the antibiotic.  You may help offset this with probiotics which you can buy at the store (ask your pharmacist if unable to find) or get probiotics in the form of eating yogurt. Do not eat or take the probiotics until 2 hours after your antibiotic. If you are unable to tolerate these side effects follow-up with your primary care provider or return to the emergency department.   If you begin to experience any blistering, rashes, swelling, or difficulty breathing seek medical care for evaluation of potentially more serious side effects.   Be sure to eat something when taking the Naproxen as it can cause stomach upset and at worst stomach bleeding. Do not take additional non steroidal anti-inflammatory medicines such as Ibuprofen, Aleve, Advil, Mobic, Diclofenac, or goodie powder while taking Naproxen. You may supplement with Tylenol.   We have prescribed you new medication(s) today. Discuss the medications prescribed today with your pharmacist as they can have adverse effects and interactions with your other medicines including over the counter and prescribed medications. Seek medical evaluation if you start to experience new or abnormal symptoms after taking one of these medicines, seek care immediately if you start to experience difficulty breathing, feeling of your throat closing, facial swelling, or rash as these could be indications of a more serious allergic reaction  If you start to experience and new or worsening symptoms return to the emergency department. If you start to experience fever, chills, neck  stiffness/pain, or inability to move your neck or open your mouth come back to the emergency department immediately.   

## 2019-09-20 NOTE — ED Triage Notes (Signed)
Pt c/o L upper dental pain x4 days, states he has been trying to get set up with a dentist. States he has used otc topical analgesics, motrin and tylenol without relief. Denies fevers or chills.

## 2021-01-11 ENCOUNTER — Other Ambulatory Visit: Payer: Self-pay

## 2021-01-11 ENCOUNTER — Ambulatory Visit
Admission: EM | Admit: 2021-01-11 | Discharge: 2021-01-11 | Disposition: A | Discharge status: Home / Self Care | Payer: Self-pay | Attending: Emergency Medicine | Admitting: Emergency Medicine

## 2021-01-11 DIAGNOSIS — J111 Influenza due to unidentified influenza virus with other respiratory manifestations: Secondary | ICD-10-CM

## 2021-01-11 MED ORDER — BENZONATATE 200 MG PO CAPS: 200.0000 mg | ORAL_CAPSULE | Freq: Three times a day (TID) | ORAL | 0 refills | Status: AC | PRN

## 2021-01-11 MED ORDER — IBUPROFEN 800 MG PO TABS: 800.0000 mg | ORAL_TABLET | Freq: Three times a day (TID) | ORAL | 0 refills | Status: DC

## 2021-01-11 MED ORDER — FLUTICASONE PROPIONATE 50 MCG/ACT NA SUSP: 1.0000 | Freq: Every day | NASAL | 0 refills | Status: DC

## 2021-01-11 NOTE — ED Provider Notes (Signed)
[facility]    CSN: [id number] Arrival date & time: 01/11/21  0946      History   Chief Complaint Chief Complaint  Patient presents with  . Cough  . Chills    HPI Joshua Hartman is a 30 y.o. male history of asthma presenting today for evaluation of cough and congestion.  Symptoms began 2 days ago.  Took at home COVID test which was negative.  Reports decreased appetite.  Reports associated body aches and subjective fevers.  Reports positive fluid exposure at home. HPI  Past Medical History:  Diagnosis Date  . Asthma     There are no problems to display for this patient.   Past Surgical History:  Procedure Laterality Date  . THUMB FUSION         Home Medications    Prior to Admission medications   Medication Sig Start Date End Date Taking? Authorizing Provider  benzonatate (TESSALON) 200 MG capsule Take 1 capsule (200 mg total) by mouth 3 (three) times daily as needed for up to 7 days for cough. 01/11/21 01/18/21 Yes Roman Sandall C, PA-C  fluticasone (FLONASE) 50 MCG/ACT nasal spray Place 1-2 sprays into both nostrils daily. 01/11/21  Yes Jakarie Pember C, PA-C  ibuprofen (ADVIL) 800 MG tablet Take 1 tablet (800 mg total) by mouth 3 (three) times daily. 01/11/21  Yes Liany Mumpower C, PA-C  ondansetron (ZOFRAN ODT) 4 MG disintegrating tablet 4mg  ODT q4 hours prn nausea/vomit 08/26/16   14/12/17, MD    Family History History reviewed. No pertinent family history.  Social History Social History   Tobacco Use  . Smoking status: Current Every Day Smoker    Types: Cigarettes  Substance Use Topics  . Alcohol use: No  . Drug use: No     Allergies   Augmentin [amoxicillin-pot clavulanate]   Review of Systems Review of Systems  Constitutional: Negative for activity change, appetite change, chills, fatigue and fever.  HENT: Positive for congestion, rhinorrhea, sinus pressure and sore throat. Negative for ear pain and trouble swallowing.    Eyes: Negative for discharge and redness.  Respiratory: Positive for cough. Negative for chest tightness and shortness of breath.   Cardiovascular: Negative for chest pain.  Gastrointestinal: Negative for abdominal pain, diarrhea, nausea and vomiting.  Musculoskeletal: Negative for myalgias.  Skin: Negative for rash.  Neurological: Negative for dizziness, light-headedness and headaches.     Physical Exam Triage Vital Signs ED Triage Vitals [01/11/21 1024]  Enc Vitals Group     BP 130/87     Pulse Rate 85     Resp 18     Temp 98.1 F (36.7 C)     Temp Source Oral     SpO2 95 %     Weight      Height      Head Circumference      Peak Flow      Pain Score 6     Pain Loc      Pain Edu?      Excl. in [place]?    No data found.  Updated Vital Signs BP 130/87 (BP Location: Right Arm)   Pulse 85   Temp 98.1 F (36.7 C) (Oral)   Resp 18   SpO2 95%   Visual Acuity Right Eye Distance:   Left Eye Distance:   Bilateral Distance:    Right Eye Near:   Left Eye Near:    Bilateral Near:     Physical Exam Vitals and  nursing note reviewed.  Constitutional:      Appearance: He is well-developed.     Comments: No acute distress  HENT:     Head: Normocephalic and atraumatic.     Ears:     Comments: Bilateral ears without tenderness to palpation of external auricle, tragus and mastoid, EAC's without erythema or swelling, TM's with good bony landmarks and cone of light. Non erythematous.     Nose: Nose normal.     Mouth/Throat:     Comments: Oral mucosa pink and moist, no tonsillar enlargement or exudate. Posterior pharynx patent and nonerythematous, no uvula deviation or swelling. Normal phonation. Eyes:     Conjunctiva/sclera: Conjunctivae normal.  Cardiovascular:     Rate and Rhythm: Normal rate and regular rhythm.  Pulmonary:     Effort: Pulmonary effort is normal. No respiratory distress.     Comments: Breathing comfortably at rest, CTABL, no wheezing, rales or other  adventitious sounds auscultated Abdominal:     General: There is no distension.  Musculoskeletal:        General: Normal range of motion.     Cervical back: Neck supple.  Skin:    General: Skin is warm and dry.  Neurological:     Mental Status: He is alert and oriented to person, place, and time.      UC Treatments / Results  Labs (all labs ordered are listed, but only abnormal results are displayed) Labs Reviewed - No data to display  EKG   Radiology No results found.  Procedures Procedures (including critical care time)  Medications Ordered in UC Medications - No data to display  Initial Impression / Assessment and Plan / UC Course  I have reviewed the triage vital signs and the nursing notes.  Pertinent labs & imaging results that were available during my care of the patient were reviewed by me and considered in my medical decision making (see chart for details).     Viral URI with cough-suspect likely flu COVID exposure at home, recommending continued symptomatic and supportive care rest and fluids. Exam reassuring, vital signs stable.  Discussed strict return precautions. Patient verbalized understanding and is agreeable with plan.  Final Clinical Impressions(s) / UC Diagnoses   Final diagnoses:  Influenza-like illness     Discharge Instructions     Ibuprofen and Tylenol as needed for fever, headaches, body aches Flonase nasal spray 1 to 2 spray in each nostril daily, may use with over-the-counter cetirizine/Zyrtec or loratadine/Claritin daily Tessalon for cough or may use other over-the-counter cough medicine such as Robitussin, Delsym, Dimetapp Rest and fluids Follow-up if not improving or worsening    ED Prescriptions    Medication Sig Dispense Auth. Provider   ibuprofen (ADVIL) 800 MG tablet Take 1 tablet (800 mg total) by mouth 3 (three) times daily. 21 tablet Kauri Garson C, PA-C   fluticasone (FLONASE) 50 MCG/ACT nasal spray Place 1-2  sprays into both nostrils daily. 16 g Cristofher Livecchi C, PA-C   benzonatate (TESSALON) 200 MG capsule Take 1 capsule (200 mg total) by mouth 3 (three) times daily as needed for up to 7 days for cough. 28 capsule Brack Shaddock, Arnold C, PA-C     PDMP not reviewed this encounter.   Lew Dawes, [place] 01/11/21 1134

## 2021-01-11 NOTE — ED Triage Notes (Signed)
Pt present coughing and sweats with some congestion. Symptoms started two days ago. Pt took an at home covid test and it was negative. Pt states he has had no appetite.

## 2021-01-11 NOTE — Discharge Instructions (Signed)
Ibuprofen and Tylenol as needed for fever, headaches, body aches Flonase nasal spray 1 to 2 spray in each nostril daily, may use with over-the-counter cetirizine/Zyrtec or loratadine/Claritin daily Tessalon for cough or may use other over-the-counter cough medicine such as Robitussin, Delsym, Dimetapp Rest and fluids Follow-up if not improving or worsening

## 2022-02-19 ENCOUNTER — Ambulatory Visit: Payer: Self-pay

## 2022-02-19 ENCOUNTER — Ambulatory Visit
Admission: RE | Admit: 2022-02-19 | Discharge: 2022-02-19 | Disposition: A | Discharge status: Home / Self Care | Payer: Self-pay | Source: Ambulatory Visit | Attending: Internal Medicine | Admitting: Internal Medicine

## 2022-02-19 VITALS — BP 158/102 | HR 87 | Temp 99.1°F | Resp 17

## 2022-02-19 DIAGNOSIS — K047 Periapical abscess without sinus: Secondary | ICD-10-CM

## 2022-02-19 DIAGNOSIS — K0889 Other specified disorders of teeth and supporting structures: Secondary | ICD-10-CM

## 2022-02-19 MED ORDER — IBUPROFEN 600 MG PO TABS: 600.0000 mg | ORAL_TABLET | Freq: Four times a day (QID) | ORAL | 0 refills | Status: DC | PRN

## 2022-02-19 MED ORDER — CLINDAMYCIN HCL 150 MG PO CAPS: 450.0000 mg | ORAL_CAPSULE | Freq: Three times a day (TID) | ORAL | 0 refills | Status: AC

## 2022-02-19 NOTE — ED Triage Notes (Signed)
Pt presents with right side dental pain and oral swelling X 2 days.

## 2022-02-19 NOTE — ED Provider Notes (Signed)
[facility]    CSN: [id number] Arrival date & time: 02/19/22  1352      History   Chief Complaint Chief Complaint  Patient presents with   Dental Pain   Oral Swelling    HPI Joshua Hartman is a 31 y.o. male.   Patient presents with right-sided dental pain and swelling that has been present for approximately 2 days.  Denies any obvious trauma or injury to the area.  Denies fever, body aches, chills, purulent drainage from the mouth.   Dental Pain  Past Medical History:  Diagnosis Date   Asthma     There are no problems to display for this patient.   Past Surgical History:  Procedure Laterality Date   THUMB FUSION         Home Medications    Prior to Admission medications   Medication Sig Start Date End Date Taking? Authorizing Provider  clindamycin (CLEOCIN) 150 MG capsule Take 3 capsules (450 mg total) by mouth 3 (three) times daily for 7 days. 02/19/22 02/26/22 Yes Django Nguyen, Michele Rockers, FNP  ibuprofen (ADVIL) 600 MG tablet Take 1 tablet (600 mg total) by mouth every 6 (six) hours as needed for mild pain or moderate pain. 02/19/22  Yes Yuchen Fedor, Hildred Alamin E, FNP  fluticasone (FLONASE) 50 MCG/ACT nasal spray Place 1-2 sprays into both nostrils daily. 01/11/21   Wieters, Hallie C, PA-C  ondansetron (ZOFRAN ODT) 4 MG disintegrating tablet 4mg  ODT q4 hours prn nausea/vomit 08/26/16   Milton Ferguson, MD    Family History Family History  Family history unknown: Yes    Social History Social History   Tobacco Use   Smoking status: Every Day    Types: Cigarettes  Substance Use Topics   Alcohol use: No   Drug use: No     Allergies   Augmentin [amoxicillin-pot clavulanate]   Review of Systems Review of Systems Per HPI  Physical Exam Triage Vital Signs ED Triage Vitals [02/19/22 1410]  Enc Vitals Group     BP (!) 158/102     Pulse Rate 87     Resp 17     Temp 99.1 F (37.3 C)     Temp Source Oral     SpO2 98 %     Weight      Height      Head  Circumference      Peak Flow      Pain Score 10     Pain Loc      Pain Edu?      Excl. in [place]?    No data found.  Updated Vital Signs BP (!) 158/102 (BP Location: Right Arm)   Pulse 87   Temp 99.1 F (37.3 C) (Oral)   Resp 17   SpO2 98%   Visual Acuity Right Eye Distance:   Left Eye Distance:   Bilateral Distance:    Right Eye Near:   Left Eye Near:    Bilateral Near:     Physical Exam Constitutional:      General: He is not in acute distress.    Appearance: Normal appearance. He is not toxic-appearing or diaphoretic.  HENT:     Head: Normocephalic and atraumatic.     Mouth/Throat:     Lips: Pink.     Mouth: Mucous membranes are moist.     Dentition: Dental tenderness and gingival swelling present.     Comments: Moderate gingival swelling and erythema located to right upper and lower dentition.  No  obvious abscess noted.  No purulent drainage noted. Eyes:     Extraocular Movements: Extraocular movements intact.     Conjunctiva/sclera: Conjunctivae normal.  Pulmonary:     Effort: Pulmonary effort is normal.  Neurological:     General: No focal deficit present.     Mental Status: He is alert and oriented to person, place, and time. Mental status is at baseline.  Psychiatric:        Mood and Affect: Mood normal.        Behavior: Behavior normal.        Thought Content: Thought content normal.        Judgment: Judgment normal.     UC Treatments / Results  Labs (all labs ordered are listed, but only abnormal results are displayed) Labs Reviewed - No data to display  EKG   Radiology No results found.  Procedures Procedures (including critical care time)  Medications Ordered in UC Medications - No data to display  Initial Impression / Assessment and Plan / UC Course  I have reviewed the triage vital signs and the nursing notes.  Pertinent labs & imaging results that were available during my care of the patient were reviewed by me and considered in my  medical decision making (see chart for details).     Will treat dental infection and dental swelling with clindamycin given Augmentin allergy.  Ibuprofen prescribed for patient to take as needed for pain.  Patient to follow-up with dentist for further evaluation and management.  Discussed return precautions.  Patient verbalized understanding and was agreeable with plan. Final Clinical Impressions(s) / UC Diagnoses   Final diagnoses:  Dental infection  Pain, dental     Discharge Instructions      You have a dental infection which is being treated with an antibiotic.  Please take this with food.  You have also been prescribed additional ibuprofen to take as needed for pain.  Please do not take any additional ibuprofen, Advil, Aleve while taking this ibuprofen.  Follow-up with dentist for further evaluation and management.    ED Prescriptions     Medication Sig Dispense Auth. Provider   clindamycin (CLEOCIN) 150 MG capsule Take 3 capsules (450 mg total) by mouth 3 (three) times daily for 7 days. 63 capsule [place], [place], [place]   ibuprofen (ADVIL) 600 MG tablet Take 1 tablet (600 mg total) by mouth every 6 (six) hours as needed for mild pain or moderate pain. 30 tablet [place], Michele Rockers, [place]      PDMP not reviewed this encounter.   Teodora Medici, [place] 02/19/22 [phone]

## 2022-02-19 NOTE — Discharge Instructions (Signed)
You have a dental infection which is being treated with an antibiotic.  Please take this with food.  You have also been prescribed additional ibuprofen to take as needed for pain.  Please do not take any additional ibuprofen, Advil, Aleve while taking this ibuprofen.  Follow-up with dentist for further evaluation and management.

## 2022-02-25 ENCOUNTER — Telehealth: Payer: Self-pay | Admitting: Family Medicine

## 2022-02-25 NOTE — Progress Notes (Signed)
The patient no-showed for appointment despite this provider sending direct link, reaching out via phone with no response and waiting for at least 10 minutes from appointment time for patient to join. They will be marked as a NS for this appointment/time.   Caspar Favila M Wiletta Bermingham, NP    

## 2022-02-27 ENCOUNTER — Encounter (HOSPITAL_COMMUNITY): Payer: Self-pay | Admitting: Emergency Medicine

## 2022-02-27 ENCOUNTER — Ambulatory Visit (HOSPITAL_COMMUNITY)
Admission: EM | Admit: 2022-02-27 | Discharge: 2022-02-27 | Disposition: A | Discharge status: Home / Self Care | Payer: Self-pay

## 2022-02-27 DIAGNOSIS — R252 Cramp and spasm: Secondary | ICD-10-CM

## 2022-02-27 DIAGNOSIS — K047 Periapical abscess without sinus: Secondary | ICD-10-CM

## 2022-02-27 NOTE — Discharge Instructions (Addendum)
Go straight to ER, do not eat or drink anything until seen by provider. Pt needs further evaluation and intervention at Emergency room (CT,IV antibiotics). Pt has failed outpt therapy with Clindamycin and is unable to open mouth d/t infection.

## 2022-02-27 NOTE — ED Notes (Signed)
Patient is being discharged from the Urgent Care and sent to the Emergency Department via POV . Per Defelice, Para March NP, patient is in need of higher level of care due to facial swelling, unable to open mouth, failed to respond to antibioitcs. Patient is aware and verbalizes understanding of plan of care.  Vitals:   02/27/22 1124  BP: (!) 165/95  Pulse: 75  Resp: 18  Temp: 99.5 F (37.5 C)  SpO2: 100%

## 2022-02-27 NOTE — ED Triage Notes (Signed)
Pt is present with right facial swelling and pain. Pt states his sx started 02/19/2022. Pt states that he has been taking the antibiotics but no relief.

## 2022-02-27 NOTE — ED Provider Notes (Signed)
[facility]    CSN: [id number] Arrival date & time: 02/27/22  3151      History   Chief Complaint Chief Complaint  Patient presents with   Dental Pain    HPI Shed Nixon is a 31 y.o. male.   31 year old male pt, Joshua Hartman, presents to ER with chief complaint of right sided dental swelling pain,unable to open mouth. Pt states he has been on clindamycin for 7 days and has not had improvement or resolution of right jaw pain/swelling. Pt has temp 99.5 in Urgent Care. Gross swelling noted to right side of face.   The history is provided by the patient. No language interpreter was used.    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 Authorizing Provider  fluticasone (FLONASE) 50 MCG/ACT nasal spray Place 1-2 sprays into both nostrils daily. 01/11/21   Wieters, Hallie C, PA-C  ibuprofen (ADVIL) 600 MG tablet Take 1 tablet (600 mg total) by mouth every 6 (six) hours as needed for mild pain or moderate pain. 02/19/22   Gustavus Bryant, FNP  ondansetron (ZOFRAN ODT) 4 MG disintegrating tablet 4mg  ODT q4 hours prn nausea/vomit 08/26/16   14/12/17, MD    Family History Family History  Family history unknown: Yes    Social History Social History   Tobacco Use   Smoking status: Every Day    Types: Cigarettes  Substance Use Topics   Alcohol use: No   Drug use: No     Allergies   Augmentin [amoxicillin-pot clavulanate]   Review of Systems Review of Systems  Constitutional:  Positive for fever.  HENT:  Positive for dental problem and facial swelling.   All other systems reviewed and are negative.    Physical Exam Triage Vital Signs ED Triage Vitals [02/27/22  1124]  Enc Vitals Group     BP (!) 165/95     Pulse Rate 75     Resp 18     Temp 99.5 F (37.5 C)     Temp src      SpO2 100 %     Weight      Height      Head Circumference      Peak Flow      Pain Score 10     Pain Loc      Pain Edu?      Excl. in GC?    No data found.  Updated Vital Signs BP (!) 165/95   Pulse 75   Temp 99.5 F (37.5 C)   Resp 18   SpO2 100%   Visual Acuity Right Eye Distance:   Left Eye Distance:   Bilateral Distance:    Right Eye Near:   Left Eye Near:    Bilateral Near:     Physical Exam Vitals and nursing note reviewed.  Constitutional:      Appearance: Normal appearance. He is well-developed.  HENT:     Head: Normocephalic.     Mouth/Throat:     Lips: Pink.     Mouth: Mucous membranes are moist.     Dentition: Abnormal dentition. Dental  tenderness, dental caries and dental abscesses present.     Pharynx: Oropharynx is clear.   Cardiovascular:     Rate and Rhythm: Normal rate and regular rhythm.     Pulses: Normal pulses.     Heart sounds: Normal heart sounds.  Pulmonary:     Effort: Pulmonary effort is normal.     Breath sounds: Normal breath sounds and air entry.  Neurological:     General: No focal deficit present.     Mental Status: He is alert and oriented to person, place, and time.     GCS: GCS eye subscore is 4. GCS verbal subscore is 5. GCS motor subscore is 6.  Psychiatric:        Attention and Perception: Attention normal.        Mood and Affect: Mood normal.        Behavior: Behavior is cooperative.      UC Treatments / Results  Labs (all labs ordered are listed, but only abnormal results are displayed) Labs Reviewed - No data to display  EKG   Radiology No results found.  Procedures Procedures (including critical care time)  Medications Ordered in UC Medications - No data to display  Initial Impression / Assessment and Plan / UC Course  I have reviewed the triage vital signs and the nursing  notes.  Pertinent labs & imaging results that were available during my care of the patient were reviewed by me and considered in my medical decision making (see chart for details).   Discussed exam findings with Dr. Leonides Grills, supervising physician, agree with plan of care: TO ER Discussed plan of care with pt, pt verbalized understanding to this provider.   Ddx: Dental abscess, outpt therapy failed on clindamycin. Final Clinical Impressions(s) / UC Diagnoses   Final diagnoses:  Dental abscess  Trismus     Discharge Instructions      Go straight to ER, do not eat or drink anything until seen by provider. Pt needs further evaluation and intervention at Emergency room (CT,IV antibiotics). Pt has failed outpt therapy with Clindamycin and is unable to open mouth d/t infection.     ED Prescriptions   None    PDMP not reviewed this encounter.   Clancy Gourd, NP 02/27/22 1302

## 2022-02-28 ENCOUNTER — Emergency Department (HOSPITAL_COMMUNITY)
Admission: EM | Admit: 2022-02-28 | Discharge: 2022-02-28 | Discharge status: Against Medical Advice | Payer: Self-pay | Attending: Emergency Medicine | Admitting: Emergency Medicine

## 2022-02-28 ENCOUNTER — Encounter (HOSPITAL_COMMUNITY): Payer: Self-pay

## 2022-02-28 ENCOUNTER — Other Ambulatory Visit: Payer: Self-pay

## 2022-02-28 DIAGNOSIS — R600 Localized edema: Secondary | ICD-10-CM | POA: Insufficient documentation

## 2022-02-28 DIAGNOSIS — Z5321 Procedure and treatment not carried out due to patient leaving prior to being seen by health care provider: Secondary | ICD-10-CM | POA: Insufficient documentation

## 2022-02-28 DIAGNOSIS — K0889 Other specified disorders of teeth and supporting structures: Secondary | ICD-10-CM | POA: Insufficient documentation

## 2022-02-28 LAB — COMPREHENSIVE METABOLIC PANEL
ALT: 9 U/L (ref 0–44)
AST: 14 U/L — ABNORMAL LOW (ref 15–41)
Albumin: 3.5 g/dL (ref 3.5–5.0)
Alkaline Phosphatase: 81 U/L (ref 38–126)
Anion gap: 13 (ref 5–15)
BUN: 11 mg/dL (ref 6–20)
CO2: 24 mmol/L (ref 22–32)
Calcium: 9.3 mg/dL (ref 8.9–10.3)
Chloride: 101 mmol/L (ref 98–111)
Creatinine, Ser: 1.19 mg/dL (ref 0.61–1.24)
GFR, Estimated: >60 mL/min (ref >60)
Glucose, Bld: 102 mg/dL — ABNORMAL HIGH (ref 70–99)
Potassium: 3.2 mmol/L — ABNORMAL LOW (ref 3.5–5.1)
Sodium: 138 mmol/L (ref 135–145)
Total Bilirubin: 0.4 mg/dL (ref 0.3–1.2)
Total Protein: 8 g/dL (ref 6.5–8.1)

## 2022-02-28 LAB — CBC WITH DIFFERENTIAL/PLATELET
Abs Immature Granulocytes: 0.07 10*3/uL (ref 0.00–0.07)
Basophils Absolute: 0.1 10*3/uL (ref 0.0–0.1)
Basophils Relative: 0 %
Eosinophils Absolute: 0.2 10*3/uL (ref 0.0–0.5)
Eosinophils Relative: 1 %
HCT: 32.3 % — ABNORMAL LOW (ref 39.0–52.0)
Hemoglobin: 11.2 g/dL — ABNORMAL LOW (ref 13.0–17.0)
Immature Granulocytes: 1 %
Lymphocytes Relative: 14 %
Lymphs Abs: 2.2 10*3/uL (ref 0.7–4.0)
MCH: 32.3 pg (ref 26.0–34.0)
MCHC: 34.7 g/dL (ref 30.0–36.0)
MCV: 93.1 fL (ref 80.0–100.0)
Monocytes Absolute: 1.2 10*3/uL — ABNORMAL HIGH (ref 0.1–1.0)
Monocytes Relative: 8 %
Neutro Abs: 11.8 10*3/uL — ABNORMAL HIGH (ref 1.7–7.7)
Neutrophils Relative %: 76 %
Platelets: 599 10*3/uL — ABNORMAL HIGH (ref 150–400)
RBC: 3.47 MIL/uL — ABNORMAL LOW (ref 4.22–5.81)
RDW: 12.4 % (ref 11.5–15.5)
WBC: 15.5 10*3/uL — ABNORMAL HIGH (ref 4.0–10.5)
nRBC: 0 % (ref 0.0–0.2)

## 2022-02-28 NOTE — ED Triage Notes (Signed)
Right sided facial swelling and right upper molar dental pain x 2 days.

## 2022-02-28 NOTE — ED Provider Triage Note (Signed)
Emergency Medicine Provider Triage Evaluation Note  Daymon Hora , a 31 y.o. male  was evaluated in triage.  Pt complains of right-sided facial swelling and right upper dental pain x2 days.  Was previously evaluated urgent care and started on clindamycin.  Was evaluated yesterday and told to come to the ED due to trismus and failed outpatient therapy.  Denies trouble breathing, trouble swallowing, fever, chills.   Review of Systems  Positive: As per HPI above Negative:   Physical Exam  BP (!) 133/97   Pulse 90   Temp 98.5 F (36.9 C) (Oral)   Resp 15   Ht 5\' 6"  (1.676 m)   Wt 68 kg   SpO2 100%   BMI 24.21 kg/m  Gen:   Awake, no distress   Resp:  Normal effort  MSK:   Moves extremities without difficulty  Other:  Unable to visualize oropharynx due to moderate level of trismus noted.  Tenderness to palpation noted to right cheek and jaw with moderate amount of swelling noted to the area.  No fluctuance noted.  No overlying skin changes.  No appreciable adenopathy.  Medical Decision Making  Medically screening exam initiated at 7:25 PM.  Appropriate orders placed.  Raphel Stickles was informed that the remainder of the evaluation will be completed by another provider, this initial triage assessment does not replace that evaluation, and the importance of remaining in the ED until their evaluation is complete.  Work-up initiated   Avery Eustice A, PA-C 02/28/22 1930

## 2022-03-01 ENCOUNTER — Encounter (HOSPITAL_COMMUNITY): Payer: Self-pay

## 2022-03-01 ENCOUNTER — Other Ambulatory Visit: Payer: Self-pay

## 2022-03-01 ENCOUNTER — Observation Stay (HOSPITAL_BASED_OUTPATIENT_CLINIC_OR_DEPARTMENT_OTHER): Payer: Self-pay | Admitting: Anesthesiology

## 2022-03-01 ENCOUNTER — Observation Stay (HOSPITAL_COMMUNITY): Payer: Self-pay

## 2022-03-01 ENCOUNTER — Observation Stay (HOSPITAL_COMMUNITY)
Admission: EM | Admit: 2022-03-01 | Discharge: 2022-03-02 | Disposition: A | Discharge status: Home / Self Care | Payer: Self-pay | Attending: Internal Medicine | Admitting: Internal Medicine

## 2022-03-01 ENCOUNTER — Emergency Department (HOSPITAL_COMMUNITY): Payer: Self-pay

## 2022-03-01 ENCOUNTER — Observation Stay (HOSPITAL_COMMUNITY): Payer: Self-pay | Admitting: Anesthesiology

## 2022-03-01 ENCOUNTER — Encounter (HOSPITAL_COMMUNITY)
Admission: EM | Disposition: A | Discharge status: Home / Self Care | Payer: Self-pay | Source: Home / Self Care | Attending: Emergency Medicine

## 2022-03-01 DIAGNOSIS — D649 Anemia, unspecified: Secondary | ICD-10-CM

## 2022-03-01 DIAGNOSIS — F17210 Nicotine dependence, cigarettes, uncomplicated: Secondary | ICD-10-CM | POA: Insufficient documentation

## 2022-03-01 DIAGNOSIS — K0889 Other specified disorders of teeth and supporting structures: Secondary | ICD-10-CM

## 2022-03-01 DIAGNOSIS — R770 Abnormality of albumin: Secondary | ICD-10-CM | POA: Insufficient documentation

## 2022-03-01 DIAGNOSIS — J449 Chronic obstructive pulmonary disease, unspecified: Secondary | ICD-10-CM

## 2022-03-01 DIAGNOSIS — D509 Iron deficiency anemia, unspecified: Secondary | ICD-10-CM | POA: Insufficient documentation

## 2022-03-01 DIAGNOSIS — K029 Dental caries, unspecified: Secondary | ICD-10-CM

## 2022-03-01 DIAGNOSIS — F1990 Other psychoactive substance use, unspecified, uncomplicated: Secondary | ICD-10-CM

## 2022-03-01 DIAGNOSIS — J45909 Unspecified asthma, uncomplicated: Secondary | ICD-10-CM | POA: Insufficient documentation

## 2022-03-01 DIAGNOSIS — M272 Inflammatory conditions of jaws: Secondary | ICD-10-CM | POA: Insufficient documentation

## 2022-03-01 DIAGNOSIS — F17200 Nicotine dependence, unspecified, uncomplicated: Secondary | ICD-10-CM

## 2022-03-01 DIAGNOSIS — E876 Hypokalemia: Secondary | ICD-10-CM | POA: Insufficient documentation

## 2022-03-01 DIAGNOSIS — K122 Cellulitis and abscess of mouth: Principal | ICD-10-CM

## 2022-03-01 HISTORY — PX: TOOTH EXTRACTION: SHX859

## 2022-03-01 LAB — CBC WITH DIFFERENTIAL/PLATELET
Abs Immature Granulocytes: 0.09 10*3/uL — ABNORMAL HIGH (ref 0.00–0.07)
Basophils Absolute: 0.1 10*3/uL (ref 0.0–0.1)
Basophils Relative: 0 %
Eosinophils Absolute: 0.1 10*3/uL (ref 0.0–0.5)
Eosinophils Relative: 1 %
HCT: 31.5 % — ABNORMAL LOW (ref 39.0–52.0)
Hemoglobin: 10.6 g/dL — ABNORMAL LOW (ref 13.0–17.0)
Immature Granulocytes: 1 %
Lymphocytes Relative: 8 %
Lymphs Abs: 1.2 10*3/uL (ref 0.7–4.0)
MCH: 31.8 pg (ref 26.0–34.0)
MCHC: 33.7 g/dL (ref 30.0–36.0)
MCV: 94.6 fL (ref 80.0–100.0)
Monocytes Absolute: 0.9 10*3/uL (ref 0.1–1.0)
Monocytes Relative: 6 %
Neutro Abs: 12.7 10*3/uL — ABNORMAL HIGH (ref 1.7–7.7)
Neutrophils Relative %: 84 %
Platelets: 542 10*3/uL — ABNORMAL HIGH (ref 150–400)
RBC: 3.33 MIL/uL — ABNORMAL LOW (ref 4.22–5.81)
RDW: 12.4 % (ref 11.5–15.5)
WBC: 15.1 10*3/uL — ABNORMAL HIGH (ref 4.0–10.5)
nRBC: 0 % (ref 0.0–0.2)

## 2022-03-01 LAB — COMPREHENSIVE METABOLIC PANEL
ALT: 9 U/L (ref 0–44)
AST: 14 U/L — ABNORMAL LOW (ref 15–41)
Albumin: 3 g/dL — ABNORMAL LOW (ref 3.5–5.0)
Alkaline Phosphatase: 65 U/L (ref 38–126)
Anion gap: 12 (ref 5–15)
BUN: 13 mg/dL (ref 6–20)
CO2: 25 mmol/L (ref 22–32)
Calcium: 9.2 mg/dL (ref 8.9–10.3)
Chloride: 100 mmol/L (ref 98–111)
Creatinine, Ser: 1.24 mg/dL (ref 0.61–1.24)
GFR, Estimated: >60 mL/min (ref >60)
Glucose, Bld: 87 mg/dL (ref 70–99)
Potassium: 3 mmol/L — ABNORMAL LOW (ref 3.5–5.1)
Sodium: 137 mmol/L (ref 135–145)
Total Bilirubin: 0.3 mg/dL (ref 0.3–1.2)
Total Protein: 7.4 g/dL (ref 6.5–8.1)

## 2022-03-01 LAB — LACTIC ACID, PLASMA: Lactic Acid, Venous: 1.5 mmol/L (ref 0.5–1.9)

## 2022-03-01 SURGERY — DENTAL RESTORATION/EXTRACTIONS
Anesthesia: General | Site: Mouth

## 2022-03-01 MED ORDER — CHLORHEXIDINE GLUCONATE CLOTH 2 % EX PADS: 6.0000 | MEDICATED_PAD | Freq: Once | CUTANEOUS | Status: DC

## 2022-03-01 MED ORDER — MIDAZOLAM HCL 2 MG/2ML IJ SOLN: 0.5000 mg | Freq: Once | INTRAMUSCULAR | Status: DC | PRN

## 2022-03-01 MED ORDER — SODIUM CHLORIDE 0.9 % IV SOLN
2.0000 g | INTRAVENOUS | Status: DC
  Administered 2022-03-01: 2 g via INTRAVENOUS
  Filled 2022-03-01: qty 20

## 2022-03-01 MED ORDER — SUCCINYLCHOLINE CHLORIDE 20 MG/ML IJ SOLN
INTRAMUSCULAR | Status: DC | PRN
  Administered 2022-03-01: 140 mg via INTRAVENOUS

## 2022-03-01 MED ORDER — FENTANYL CITRATE (PF) 250 MCG/5ML IJ SOLN
INTRAMUSCULAR | Status: DC | PRN
  Administered 2022-03-01 (×7): 50 ug via INTRAVENOUS

## 2022-03-01 MED ORDER — ONDANSETRON HCL 4 MG/2ML IJ SOLN
4.0000 mg | Freq: Once | INTRAMUSCULAR | Status: AC
  Administered 2022-03-01: 4 mg via INTRAVENOUS
  Filled 2022-03-01: qty 2

## 2022-03-01 MED ORDER — ONDANSETRON HCL 4 MG PO TABS: 4.0000 mg | ORAL_TABLET | Freq: Four times a day (QID) | ORAL | Status: DC | PRN

## 2022-03-01 MED ORDER — MIDAZOLAM HCL 5 MG/5ML IJ SOLN
INTRAMUSCULAR | Status: DC | PRN
  Administered 2022-03-01: 1 mg via INTRAVENOUS

## 2022-03-01 MED ORDER — FENTANYL CITRATE (PF) 250 MCG/5ML IJ SOLN
INTRAMUSCULAR | Status: AC
  Filled 2022-03-01: qty 5

## 2022-03-01 MED ORDER — LIDOCAINE-EPINEPHRINE 2 %-1:100000 IJ SOLN
INTRAMUSCULAR | Status: AC
  Filled 2022-03-01: qty 1

## 2022-03-01 MED ORDER — METRONIDAZOLE 500 MG/100ML IV SOLN
500.0000 mg | Freq: Two times a day (BID) | INTRAVENOUS | Status: DC
  Administered 2022-03-01 – 2022-03-02 (×2): 500 mg via INTRAVENOUS
  Filled 2022-03-01 (×2): qty 100

## 2022-03-01 MED ORDER — ONDANSETRON HCL 4 MG/2ML IJ SOLN: 4.0000 mg | Freq: Four times a day (QID) | INTRAMUSCULAR | Status: DC | PRN

## 2022-03-01 MED ORDER — MEPERIDINE HCL 25 MG/ML IJ SOLN: 6.2500 mg | INTRAMUSCULAR | Status: DC | PRN

## 2022-03-01 MED ORDER — ACETAMINOPHEN 650 MG RE SUPP: 650.0000 mg | Freq: Four times a day (QID) | RECTAL | Status: DC | PRN

## 2022-03-01 MED ORDER — ROCURONIUM BROMIDE 100 MG/10ML IV SOLN
INTRAVENOUS | Status: DC | PRN
  Administered 2022-03-01: 10 mg via INTRAVENOUS
  Administered 2022-03-01: 50 mg via INTRAVENOUS

## 2022-03-01 MED ORDER — IOHEXOL 300 MG/ML  SOLN
75.0000 mL | Freq: Once | INTRAMUSCULAR | Status: AC | PRN
  Administered 2022-03-01: 75 mL via INTRAVENOUS

## 2022-03-01 MED ORDER — OXYMETAZOLINE HCL 0.05 % NA SOLN
NASAL | Status: DC | PRN
  Administered 2022-03-01: 2 via NASAL

## 2022-03-01 MED ORDER — HYDROMORPHONE HCL 1 MG/ML IJ SOLN
INTRAMUSCULAR | Status: AC
  Filled 2022-03-01: qty 1

## 2022-03-01 MED ORDER — OXYMETAZOLINE HCL 0.05 % NA SOLN
NASAL | Status: DC | PRN
  Administered 2022-03-01: 1

## 2022-03-01 MED ORDER — 0.9 % SODIUM CHLORIDE (POUR BTL) OPTIME
TOPICAL | Status: DC | PRN
  Administered 2022-03-01: 2000 mL

## 2022-03-01 MED ORDER — PROPOFOL 10 MG/ML IV BOLUS
INTRAVENOUS | Status: AC
  Filled 2022-03-01: qty 20

## 2022-03-01 MED ORDER — POTASSIUM CHLORIDE 10 MEQ/100ML IV SOLN
10.0000 meq | Freq: Once | INTRAVENOUS | Status: AC
  Administered 2022-03-01: 10 meq via INTRAVENOUS
  Filled 2022-03-01: qty 100

## 2022-03-01 MED ORDER — ACETAMINOPHEN 10 MG/ML IV SOLN
INTRAVENOUS | Status: DC | PRN
  Administered 2022-03-01: 1000 mg via INTRAVENOUS

## 2022-03-01 MED ORDER — ACETAMINOPHEN 10 MG/ML IV SOLN
INTRAVENOUS | Status: AC
  Filled 2022-03-01: qty 100

## 2022-03-01 MED ORDER — LIDOCAINE 2% (20 MG/ML) 5 ML SYRINGE
INTRAMUSCULAR | Status: DC | PRN
  Administered 2022-03-01: 40 mg via INTRAVENOUS

## 2022-03-01 MED ORDER — OXYCODONE HCL 5 MG PO TABS: 5.0000 mg | ORAL_TABLET | ORAL | Status: DC | PRN

## 2022-03-01 MED ORDER — OXYCODONE HCL 5 MG PO TABS: 5.0000 mg | ORAL_TABLET | Freq: Once | ORAL | Status: DC | PRN

## 2022-03-01 MED ORDER — HYDROMORPHONE HCL 1 MG/ML IJ SOLN
0.2500 mg | INTRAMUSCULAR | Status: DC | PRN
  Administered 2022-03-01 – 2022-03-02 (×2): 0.5 mg via INTRAVENOUS

## 2022-03-01 MED ORDER — ONDANSETRON HCL 4 MG/2ML IJ SOLN
INTRAMUSCULAR | Status: DC | PRN
  Administered 2022-03-01: 4 mg via INTRAVENOUS

## 2022-03-01 MED ORDER — SUGAMMADEX SODIUM 200 MG/2ML IV SOLN
INTRAVENOUS | Status: DC | PRN
  Administered 2022-03-01: 200 mg via INTRAVENOUS

## 2022-03-01 MED ORDER — PROPOFOL 10 MG/ML IV BOLUS
INTRAVENOUS | Status: DC | PRN
  Administered 2022-03-01: 200 mg via INTRAVENOUS

## 2022-03-01 MED ORDER — OXYCODONE HCL 5 MG/5ML PO SOLN: 5.0000 mg | Freq: Once | ORAL | Status: DC | PRN

## 2022-03-01 MED ORDER — MORPHINE SULFATE (PF) 4 MG/ML IV SOLN
4.0000 mg | Freq: Once | INTRAVENOUS | Status: AC
  Administered 2022-03-01: 4 mg via INTRAVENOUS
  Filled 2022-03-01: qty 1

## 2022-03-01 MED ORDER — LACTATED RINGERS IV SOLN: INTRAVENOUS | Status: DC | PRN

## 2022-03-01 MED ORDER — KETOROLAC TROMETHAMINE 30 MG/ML IJ SOLN
30.0000 mg | Freq: Four times a day (QID) | INTRAMUSCULAR | Status: DC | PRN
  Administered 2022-03-01 – 2022-03-02 (×2): 30 mg via INTRAVENOUS
  Filled 2022-03-01 (×2): qty 1

## 2022-03-01 MED ORDER — ACETAMINOPHEN 325 MG PO TABS: 650.0000 mg | ORAL_TABLET | Freq: Four times a day (QID) | ORAL | Status: DC | PRN

## 2022-03-01 MED ORDER — OXYMETAZOLINE HCL 0.05 % NA SOLN
NASAL | Status: AC
  Filled 2022-03-01: qty 30

## 2022-03-01 MED ORDER — MIDAZOLAM HCL 2 MG/2ML IJ SOLN
INTRAMUSCULAR | Status: AC
  Filled 2022-03-01: qty 2

## 2022-03-01 MED ORDER — LIDOCAINE-EPINEPHRINE 2 %-1:100000 IJ SOLN
INTRAMUSCULAR | Status: DC | PRN
  Administered 2022-03-01: 7 mL via INTRADERMAL

## 2022-03-01 SURGICAL SUPPLY — 46 items
BAG COUNTER SPONGE SURGICOUNT (BAG) ×1 IMPLANT
BLADE SURG 15 STRL LF DISP TIS (BLADE) ×1 IMPLANT
BLADE SURG 15 STRL SS (BLADE) ×3
BUR CROSS CUT (BURR) ×1
BUR CROSS CUT FISSURE 1.6 (BURR) ×3 IMPLANT
BUR SRG MED 1.6XXCUT FSSR (BURR) IMPLANT
BURR SRG MED 1.6XXCUT FSSR (BURR) ×1
CANISTER SUCT 3000ML PPV (MISCELLANEOUS) ×2 IMPLANT
COVER SURGICAL LIGHT HANDLE (MISCELLANEOUS) ×2 IMPLANT
DRAIN PENROSE 18X1/4 LTX STRL (DRAIN) ×1 IMPLANT
DRAPE U-SHAPE 76X120 STRL (DRAPES) ×2 IMPLANT
GAUZE PACKING FOLDED 2  STR (GAUZE/BANDAGES/DRESSINGS) ×1
GAUZE PACKING FOLDED 2 STR (GAUZE/BANDAGES/DRESSINGS) ×1 IMPLANT
GAUZE SPONGE 4X4 12PLY STRL LF (GAUZE/BANDAGES/DRESSINGS) ×2 IMPLANT
GLOVE BIO SURGEON STRL SZ7 (GLOVE) ×1 IMPLANT
GLOVE BIO SURGEON STRL SZ7.5 (GLOVE) ×1 IMPLANT
GLOVE BIO SURGEON STRL SZ8 (GLOVE) ×2 IMPLANT
GLOVE BIOGEL PI IND STRL 6 (GLOVE) IMPLANT
GLOVE BIOGEL PI IND STRL 6.5 (GLOVE) IMPLANT
GLOVE BIOGEL PI IND STRL 8 (GLOVE) IMPLANT
GLOVE BIOGEL PI INDICATOR 6 (GLOVE) ×1
GLOVE BIOGEL PI INDICATOR 6.5 (GLOVE) ×1
GLOVE BIOGEL PI INDICATOR 8 (GLOVE)
GOWN STRL REUS W/ TWL LRG LVL3 (GOWN DISPOSABLE) ×1 IMPLANT
GOWN STRL REUS W/ TWL XL LVL3 (GOWN DISPOSABLE) ×1 IMPLANT
GOWN STRL REUS W/TWL LRG LVL3 (GOWN DISPOSABLE) ×1
GOWN STRL REUS W/TWL XL LVL3 (GOWN DISPOSABLE) ×1
IV NS 1000ML (IV SOLUTION) ×2
IV NS 1000ML BAXH (IV SOLUTION) ×1 IMPLANT
KIT BASIN OR (CUSTOM PROCEDURE TRAY) ×2 IMPLANT
KIT TURNOVER KIT B (KITS) ×2 IMPLANT
NDL HYPO 25GX1X1/2 BEV (NEEDLE) ×2 IMPLANT
NEEDLE HYPO 25GX1X1/2 BEV (NEEDLE) ×4 IMPLANT
NS IRRIG 1000ML POUR BTL (IV SOLUTION) ×2 IMPLANT
PAD ARMBOARD 7.5X6 YLW CONV (MISCELLANEOUS) ×2 IMPLANT
SLEEVE IRRIGATION ELITE 7 (MISCELLANEOUS) ×2 IMPLANT
SPIKE FLUID TRANSFER (MISCELLANEOUS) ×2 IMPLANT
SPONGE SURGIFOAM ABS GEL 12-7 (HEMOSTASIS) IMPLANT
SUT CHROMIC 3 0 PS 2 (SUTURE) ×5 IMPLANT
SUT ETHILON 2 0 FS 18 (SUTURE) ×1 IMPLANT
SYR BULB IRRIG 60ML STRL (SYRINGE) ×2 IMPLANT
SYR CONTROL 10ML LL (SYRINGE) ×3 IMPLANT
TRAY ENT MC OR (CUSTOM PROCEDURE TRAY) ×2 IMPLANT
TUBE SALEM SUMP 16 FR W/ARV (TUBING) ×2 IMPLANT
TUBING IRRIGATION (MISCELLANEOUS) ×2 IMPLANT
YANKAUER SUCT BULB TIP NO VENT (SUCTIONS) ×2 IMPLANT

## 2022-03-01 NOTE — Hospital Course (Signed)
Admitted 03/01/2022  Allergies: Augmentin [amoxicillin-pot clavulanate] Pertinent Hx: recurrent dental infections  31 y.o. male p/w R face pain and swelling  *R masticator abscess: Undergoing surgery tonight 06/17.  Consults: Dental  Meds: rocephin, flagyl, pain meds VTE ppx: SCDs IVF: none Diet: NPO

## 2022-03-01 NOTE — Anesthesia Preprocedure Evaluation (Addendum)
Anesthesia Evaluation  Patient identified by MRN, date of birth, ID band Patient awake    Reviewed: Allergy & Precautions, NPO status , Patient's Chart, lab work & pertinent test results  History of Anesthesia Complications Negative for: history of anesthetic complications  Airway Mallampati: Unable to assess  TM Distance: >3 FB Neck ROM: Full  Mouth opening: Limited Mouth Opening  Dental  (+) Dental Advisory Given, Poor Dentition, Missing, Chipped, Loose   Pulmonary asthma , COPD, Current SmokerPatient did not abstain from smoking.,    breath sounds clear to auscultation       Cardiovascular negative cardio ROS   Rhythm:Regular Rate:Normal     Neuro/Psych negative neurological ROS     GI/Hepatic negative GI ROS, Neg liver ROS,   Endo/Other  negative endocrine ROS  Renal/GU negative Renal ROS     Musculoskeletal   Abdominal   Peds  Hematology  (+) Blood dyscrasia (Hb 10.6), anemia ,   Anesthesia Other Findings   Reproductive/Obstetrics                            Anesthesia Physical Anesthesia Plan  ASA: 3  Anesthesia Plan: General   Post-op Pain Management: Ofirmev IV (intra-op)*   Induction: Intravenous  PONV Risk Score and Plan: 1 and Ondansetron and Treatment may vary due to age or medical condition  Airway Management Planned: Oral ETT and Video Laryngoscope Planned  Additional Equipment: None  Intra-op Plan:   Post-operative Plan: Extubation in OR  Informed Consent: I have reviewed the patients History and Physical, chart, labs and discussed the procedure including the risks, benefits and alternatives for the proposed anesthesia with the patient or authorized representative who has indicated his/her understanding and acceptance.     Dental advisory given  Plan Discussed with: CRNA and Surgeon  Anesthesia Plan Comments: (Pt understands plan, if cannot secure airway  asleep, will awaken and do awake fiberoptic intubation. They understand severity of airway, including death)       Anesthesia Quick Evaluation

## 2022-03-01 NOTE — ED Notes (Signed)
Patient transported to X-ray 

## 2022-03-01 NOTE — Op Note (Signed)
PRE-OPERATIVE DIAGNOSIS:  Odontogenic infection POST-OPERATIVE DIAGNOSIS:  Same    SURGEON:  Surgeon(s) and Role:    Ross Marcus, Lavell Anchors, DMD - Primary   Procedure: Removal of full bony impacted #17 and 32 (D7240 x 2) Removal of retained roots #2, 14, 15, 18, 19, 26, 30 (D7250 x 7) Surgical removal of teeth #31 (A1937)   Extra-oral I&D of sublingual abscess (90240) Extra-oral I&D of submandibular abscess (97353) Extra-oral I&D of masticator space abscess (29924)     Description of Operation/Procedure:   The patient was encountered in [facility] OR Room 9.  General anesthesia was induced and a nasal ETT was secured in the standard fashion.  The table was moved slightly away from anesthesia and the patient was properly padded, relieving all pressure points.  A formal time-out was executed.  2% lidocaine with 1:100,000 epinephrine was infiltrated into the proposed surgical sites.  The patient was prepped and draped in the standard sterile fashion and a throat pack was placed.                Attention was directed intraorally - teeth #2, 14-15, 17-19, and 26 were removed surgically without complication. Sockets were curetted and bone was smoothed in these area and soft tissues were closed as needed.     Attention was then directed intraorally to the lower right.  A full thickness mucoperiosteal flap was elevated on both the buccal and lingual.  Teeth #30-32 were then removed surgically with a handpiece without complication. The extraction sites were thoroughly curetted, bone was smoothed, and the sites thoroughly irrigated. Transcutaneous drainage sites were then carefully marked beneath the inferior border of the mandible to allow dependent drainage, avoid neurovascular structures, and to promote esthetics.  Incisions were made through skin and subcutaneous tissues and hemostasis was obtained.  The platysma was identified and divided.  Blunt dissection was carried to the mandible with intraoral digital  guidance.  The following spaces were bluntly explored yielding purulent drainage: submandibular and masticator yielding purulence.  This was sent for aerobic and anaerobic cultures and a stat Gram stain.   Intraoral access was used to assure proper 1/4'" Penrose drain placement into each fascial space.  A total of 2 drains were placed and secured with 3-0 nylon sutures in the standard fashion to allow for post-surgical advancement when appropriate.  All areas were thoroughly irrigated.  The full thickness mucoperiosteal flap was reapproximated with 3-0 chromic gut suture.  A burn net dressing was fashioned to secure kerlix fluffs over the extraoral drains.   The oral cavity was suctioned free of all debris and secretions.  The teeth were brushed. The throat pack was removed and an orogastric tube was passed to evacuate the stomach contents.  Sponge and needle counts were correct x 2.  Care of the patient was turned over to the Anesthesia team for uneventful extubation and delivery of the patient to the PACU in stable condition.   ANESTHESIA:   general EBL:  50 mL  DRAINS: Penrose drain in the right neck X 1 LOCAL MEDICATIONS USED:  LIDOCAINE 2% w/ 1:100000 epi SPECIMEN:  Aspirate - cultures for anaerobes, aerobes, and fungal PLAN OF CARE: Return to floor PATIENT DISPOSITION:  PACU - hemodynamically stable. Delay start of Pharmacological VTE agent (>24hrs) due to surgical blood loss or risk of bleeding: no   OMFS Recommendations -Liquid diet today, advance to soft mechanical diet tomorrow -Peridex (chlorhexidine) mouthrnise QID -Change Kerlix dressing as needed -Continue Ceftriaxone/Flagyl -Follow cultures -Daily  CBC w/ diff   Herbie Saxon, DDS Oral and Maxillofacial Surgeon [facility] [place]) Office # [phone] Cell # [phone]

## 2022-03-01 NOTE — H&P (Signed)
NAME:  Joshua Hartman, MRN:  [id number], DOB:  1991-04-21, LOS: 0 ADMISSION DATE:  03/01/2022, Primary: Patient, No Pcp Per  CHIEF COMPLAINT:  facial swelling   Medical Service: Internal Medicine Teaching Service         Attending Physician: Dr. Charise Killian, MD    First Contact: Dr. Howie Ill Pager: [phone]  Second Contact: Dr. Collene Gobble Pager: [phone]       After Hours (After 5p/  First Contact Pager: [phone]  weekends / holidays): Second Contact Pager: [place]   Joshua Hartman is 31yo person with asthma and recent dental infection presenting to [facility] earlier today for worsening facial swelling, pain. Patient initially started having swelling and pain on the right side of his mouth about two weeks ago. At that time he was seen by the Emergency Department and started on oral clindamycin. Since that time he reports worsening symptoms despite compliance with the antibiotic. Mentions difficulty chewing food, has not had much to eat in the last few days. Patient decided this morning that his symptoms were not tolerable and returned to the ED. He has had multiple similar infections in the past. He has been taking over the counter ibuprofen for pain as well as two of his family member's Percocet when the pain was unbearable. Mentions trying cocaine on the area 3-4 days ago without relief of pain. He denies chest pain, dyspnea, abdominal pain, fevers, chills.  Of note, he does not have a regular primary care physician or dentist.  PCP: Patient, No Pcp Per  ED COURSE   Upon arrival to [facility], patient afebrile and hemodynamically stable. Lab work revealed significant leukocytosis and chronic anemia. OMFS was consulted by ED and requested medical admission. IMTS was subsequently consulted for admission.   PAST MEDICAL HISTORY  He,  has a past medical history of Asthma.   HOME MEDICATIONS   Prior to Admission medications   Medication Sig Start Date End Date Taking? Authorizing  Provider  fluticasone (FLONASE) 50 MCG/ACT nasal spray Place 1-2 sprays into both nostrils daily. 01/11/21   Wieters, Hallie C, PA-C  ibuprofen (ADVIL) 600 MG tablet Take 1 tablet (600 mg total) by mouth every 6 (six) hours as needed for mild pain or moderate pain. 02/19/22   Teodora Medici, FNP  ondansetron (ZOFRAN ODT) 4 MG disintegrating tablet 4mg  ODT q4 hours prn nausea/vomit 08/26/16   Milton Ferguson, MD    ALLERGIES   Allergies as of 03/01/2022 - Review Complete 03/01/2022  Allergen Reaction Noted   Augmentin [amoxicillin-pot clavulanate]  02/08/2015    SOCIAL HISTORY  Patient lives here in [place] with his significant other. Reports having four young children. Smokes cigarettes 1/2 ppd for "awhile now." No alcohol use. Reports marijuana (except for the last week) and has tried cocaine, including trying to place cocaine in his mouth to relieve pain.   FAMILY HISTORY  His Family history is unknown by patient.   REVIEW OF SYSTEMS  ROS per history of present illness.  PHYSICAL EXAMINATION  Blood pressure (!) 151/94, pulse (!) 102, temperature 98 F (36.7 C), temperature source Oral, resp. rate 16, SpO2 100 %.    There were no vitals filed for this visit.  GENERAL: Resting comfortably in bed, no acute distress HENT: Significant swelling on R side of face. Trismus appreciated.  CV: Regular rate, rhythm. No murmurs, rubs, gallops. Distal pulses 2+ bilaterally. PULM: Normal respiratory effort on room air. Clear to ausculation bilaterally. ABD:  Soft, non-tender,  non-distended. Normoactive bowel sounds. MSK: Normal bulk, tone. No pitting edema bilateral lower extremities. SKIN:  Warm, dry. No rashes or lesions appreciated. NEURO: Awake, alert, conversing appropriately. Grossly non-focal. PSYCH: Normal mood, affect, speech  SIGNIFICANT DIAGNOSTIC TESTS   CT soft tissue neck w/ contrast: -5.3 x 5.1 x 2.1 cm ill-defined fluid collection in the right masticator space uplifts the  masseter muscle consistent with in absence secondary to adjacent dental disease. -Prominent dental caries involving the second premolar, first molar and third molar of the right mandible. The third molar is likely the etiology of the abscess. -Multiple other dental caries are present bilaterally. -Reactive adenopathy. -Mild degenerative changes of the cervical spine are most pronounced at C5-6 with uncovertebral spurring leading to left greater than right foraminal narrowing.  LABS      Latest Ref Rng & Units 03/01/2022   12:20 PM 02/28/2022    8:10 PM 08/26/2016    1:31 PM  CBC  WBC 4.0 - 10.5 K/uL 15.1  15.5  7.9   Hemoglobin 13.0 - 17.0 g/dL 22.6  33.3  54.5   Hematocrit 39.0 - 52.0 % 31.5  32.3  34.6   Platelets 150 - 400 K/uL 542  599  206       Latest Ref Rng & Units 03/01/2022   12:20 PM 02/28/2022    8:10 PM 08/26/2016    1:31 PM  BMP  Glucose 70 - 99 mg/dL 87  625  95   BUN 6 - 20 mg/dL 13  11  16    Creatinine 0.61 - 1.24 mg/dL  6.38  9.37   Sodium 135 - 145 mmol/L 137  138  141   Potassium 3.5 - 5.1 mmol/L 3.0  3.2  3.8   Chloride 98 - 111 mmol/L 100  101  106   CO2 22 - 32 mmol/L 25  24  25    Calcium 8.9 - 10.3 mg/dL 9.2  9.3  8.0    CONSULTS  OMFS  ASSESSMENT   Joshua Hartman is 31yo person with asthma and recent dental infection admitted 6/17 with masticator space abscess.  PLAN   Principal Problem:   Abscess of masticator space of mouth Active Problems:   Chronic anemia   Dental caries   Tobacco use disorder   Polysubstance use disorder  #Abscess of masticator space #Multiple dental caries Patient presenting today with two week history of right-sided facial swelling and dental pain. Patient hemodynamically stable, oxygenating well on room air. Lab work with leukocytosis and chronic anemia. Imaging revealed large masticator space abscess. OMFS consulted by ED, planning on surgical intervention this evening. Patient does have a significant allergy to  Augmentin, will continue with cephalosporin and Flagyl. Will check A1c given he has had multiple of these infections in the past. He will also need close follow-up with dentistry due to multiple dental caries. - Appreciate OMFS assistance, surgery this evening - Continue ceftriaxone, metronidazole - NPO, will follow OMFS for diet recommendations post-operatively - Needs outpatient follow-up with dentistry - Follow-up A1c  #Chronic normocytic anemia Hgb today on arrival 10.6. In review of patient's chart, he has had chronic anemia for at least five years. No signs/symptoms of bleeding, no chronic renal disease. Patient seemingly is healthy 31 year old, will check studies. - Follow-up iron, B12, folate studies - CBC in AM  #Hypoalbuminemia Albumin 3.0, patient also has not been eating much lately. Will need to repeat this after hospitalization and taking PO well. - Follow-up outpatient hepatic  function panel  #Tobacco use disorder #Polysubstance use disorder Patient reports daily tobacco and marijuana use. Also has had intermittent cocaine use. Will need to continue encouraging smoking cessation.  - Nicotine patch  BEST PRACTICE  DIET: NPO IVF: n/a DVT PPX: n/a BOWEL: n/a CODE: FULL FAM COM: n/a  DISPO: Admit patient to Observation with expected length of stay less than 2 midnights.  Evlyn Kanner, MD Internal Medicine Resident PGY-2 PAGER: [phone] 03/01/2022 4:44 PM  If after hours (below), please contact on-call pager: [phone] 5PM-7AM Monday-Friday 1PM-7AM Saturday-Sunday

## 2022-03-01 NOTE — H&P (Addendum)
H&P Infection  Exam Date: 03/01/22  ID: The patient is a 68 yoM who presented with pain and swelling of the right masticator space.  History of Present Illness:  The patient reports having pain and swelling that began several days ago - he tried a course of clindamycin without benefit.  The patient was referred by ED for evaluation.  The patient  reports trismus and intermittent fevers. Denies dyspnea or dysphagia.  The patient has been started on Ceftriaxone/Flagyl and is to be admitted by the medicine service. OMFS consulted for drainage of dental abscess. Pt is NPO awaiting OR.     Clinical Exam: Extraoral Exam:              Patient is alert, orientated and in mild distress             CN II-XII grossly intact             There is appreciable facial swelling on the right side of the face.              The swelling extends into the neck region    Intraoral Exam:              The patient does have trismus with maximum incisal opening of 10 mm.             The buccal vestibule is raised.             The floor of mouth is soft and non-elevated             The tongue is not elevated.             There is not lateral pharyngeal swelling with the uvula midline             There is no palatal draping present.             Oral airway is patent              Generalized gross caries               Cardiovascular:  Regular rate and rhythm without any appreciable murmurs, gallops or rubs.               Respiratory: Lungs were clear to auscultation bilaterally without any wheezing or rhonchi.             Abdomen: Non-distended    Radiographic Exam:               Panorex shows generalized gross caries, impacted third molars with periapical radiolucencies associated with #32.                          A CT of the larynx with contrast was obtained showing fluid collections/cellulitis associated with the right masticator space starting to spread inferiorly and impinging on the right submandibular  space. There is no airway deviation.    Assessment: 31 yoM patient with a right masticator space infection.    Plan:  The patient will require incision and drainage of the masticator space infection with extraction of teeth as necessary, including #32, in the OR.  Consent was obtained and will be scanned into EPIC.   Risks, complications and alternatives of tooth extraction and incision and drainage procedures were discussed and questions were answered.  Among all potential risks and complications, I emphasized the potential for pain, bleeding, swelling, infection, localized alveolar osteitis (dry socket),  temporary and permanent lingual and inferior alveolar nerve injury, oroantral (sinus) communication, oronasal communication, jaw fracture, damage to adjacent teeth and tissue, joint discomfort, bone/tooth fragments, recurrence of infection, need for additional procedures, facial nerve injury, scarring, limited mouth opening, drain placement, aspiration and anesthetic mishap.   Herbie Saxon, DDS Oral and Maxillofacial Surgeon [facility] [place]) Office # [phone] Cell # [phone]

## 2022-03-01 NOTE — ED Notes (Signed)
ED TO INPATIENT HANDOFF REPORT  ED Nurse Name and Phone #: [phone]  S Name/Age/Gender Joshua Hartman 31 y.o. male Room/Bed: H021C/H021C  Code Status   Code Status: Full Code  Home/SNF/Other Home Patient oriented to: self, place, time, and situation Is this baseline? Yes   Triage Complete: Triage complete  Chief Complaint Facial abscess [L02.01]  Triage Note Patient here to have further work up for 2 weeks of facial swelling. Had labs last night, increased pain   Allergies Allergies  Allergen Reactions   Augmentin [Amoxicillin-Pot Clavulanate]     Unknown    Level of Care/Admitting Diagnosis ED Disposition     ED Disposition  Admit   Condition  --   Comment  Hospital Area: [facility] [[id number]]  Level of Care: Med-Surg [16]  May place patient in observation at [facility] or [facility] if equivalent level of care is available:: No  Covid Evaluation: Asymptomatic - no recent exposure (last 10 days) testing not required  Diagnosis: Facial abscess [353299]  Admitting Physician: Dickie La [[id number]]  Attending Physician: Dickie La [[id number]]          B Medical/Surgery History Past Medical History:  Diagnosis Date   Asthma    Past Surgical History:  Procedure Laterality Date   THUMB FUSION       A IV Location/Drains/Wounds Patient Lines/Drains/Airways Status     Active Line/Drains/Airways     Name Placement date Placement time Site Days   Peripheral IV 03/01/22 20 G Left Antecubital 03/01/22  1247  Antecubital  less than 1            Intake/Output Last 24 hours  Intake/Output Summary (Last 24 hours) at 03/01/2022 1742 Last data filed at 03/01/2022 1702 Gross per 24 hour  Intake 280.96 ml  Output --  Net 280.96 ml    Labs/Imaging Results for orders placed or performed during the hospital encounter of 03/01/22 (from the past 48 hour(s))  Comprehensive metabolic panel     Status: Abnormal   Collection Time: 03/01/22 12:20  PM  Result Value Ref Range   Sodium 137 135 - 145 mmol/L   Potassium 3.0 (L) 3.5 - 5.1 mmol/L   Chloride 100 98 - 111 mmol/L   CO2 25 22 - 32 mmol/L   Glucose, Bld 87 70 - 99 mg/dL    Comment: Glucose reference range applies only to samples taken after fasting for at least 8 hours.   BUN 13 6 - 20 mg/dL   Creatinine, Ser 7.98 0.61 - 1.24 mg/dL   Calcium 9.2 8.9 - 92.1 mg/dL   Total Protein 7.4 6.5 - 8.1 g/dL   Albumin 3.0 (L) 3.5 - 5.0 g/dL   AST 14 (L) 15 - 41 U/L   ALT 9 0 - 44 U/L   Alkaline Phosphatase 65 38 - 126 U/L   Total Bilirubin 0.3 0.3 - 1.2 mg/dL   GFR, Estimated >19 >41 mL/min    Comment: (NOTE) Calculated using the CKD-EPI Creatinine Equation (2021)    Anion gap 12 5 - 15    Comment: Performed at [facility], [address], [place], [place] [address]  CBC with Differential     Status: Abnormal   Collection Time: 03/01/22 12:20 PM  Result Value Ref Range   WBC 15.1 (H) 4.0 - 10.5 K/uL   RBC 3.33 (L) 4.22 - 5.81 MIL/uL   Hemoglobin 10.6 (L) 13.0 - 17.0 g/dL   HCT 44.8 (L) 18.5 - 63.1 %  MCV 94.6 80.0 - 100.0 fL   MCH 31.8 26.0 - 34.0 pg   MCHC 33.7 30.0 - 36.0 g/dL   RDW 53.6 64.4 - 03.4 %   Platelets 542 (H) 150 - 400 K/uL   nRBC 0.0 0.0 - 0.2 %   Neutrophils Relative % 84 %   Neutro Abs 12.7 (H) 1.7 - 7.7 K/uL   Lymphocytes Relative 8 %   Lymphs Abs 1.2 0.7 - 4.0 K/uL   Monocytes Relative 6 %   Monocytes Absolute 0.9 0.1 - 1.0 K/uL   Eosinophils Relative 1 %   Eosinophils Absolute 0.1 0.0 - 0.5 K/uL   Basophils Relative 0 %   Basophils Absolute 0.1 0.0 - 0.1 K/uL   Immature Granulocytes 1 %   Abs Immature Granulocytes 0.09 (H) 0.00 - 0.07 K/uL    Comment: Performed at [facility], [address], [place], [place] [address]  Lactic acid, plasma     Status: None   Collection Time: 03/01/22 12:20 PM  Result Value Ref Range   Lactic Acid, Venous 1.5 0.5 - 1.9 mmol/L    Comment: Performed at [facility], [address]  [place], [place] [address]   DG Orthopantogram  Result Date: 03/01/2022 CLINICAL DATA:  Dental abscess. EXAM: ORTHOPANTOGRAM/PANORAMIC COMPARISON:  CT neck with contrast 03/01/2022 FINDINGS: Large dental caries again noted in the impacted right third mandibular molar with lucency surrounding the inferior root. The residual second molar is intact. A small portion of the residual tooth root is present 4 other the right first mandibular molar or second premolar. Prominent dental caries is present in the right canine tooth. Multiple dental caries is present in the residual left maxillary and mandibular molars. Prominent dental caries present in the right second maxillary molar and right third maxillary molar. IMPRESSION: 1. Multiple dental caries as described. 2. Lucency surrounding the inferior root of the right third mandibular molar concerning for abscess. This corresponds with the abscess into the right masticator space. Electronically Signed   By: Marin Roberts M.D.   On: 03/01/2022 16:38   CT Soft Tissue Neck W Contrast  Result Date: 03/01/2022 CLINICAL DATA:  Soft tissue swelling, infection suspected. EXAM: CT NECK WITH CONTRAST TECHNIQUE: Multidetector CT imaging of the neck was performed using the standard protocol following the bolus administration of intravenous contrast. RADIATION DOSE REDUCTION: This exam was performed according to the departmental dose-optimization program which includes automated exposure control, adjustment of the mA and/or kV according to patient size and/or use of iterative reconstruction technique. CONTRAST:  33mL OMNIPAQUE IOHEXOL 300 MG/ML  SOLN COMPARISON:  None Available. FINDINGS: Pharynx and larynx: No focal mucosal or submucosal lesions are present. Nasopharynx is clear. Soft palate and tongue base are within normal limits. Vallecula and epiglottis are within normal limits. Aryepiglottic folds and piriform sinuses are clear. Vocal cords are midline and symmetric.  Trachea is clear. Salivary glands: Marked edema surrounds the right sub mandibular gland. Submandibular glands and ducts are otherwise within normal limits. Mass effect noted on the right parotid gland. The parotid glands are otherwise normal. Thyroid: Normal thyroid Lymph nodes: Enlarged right greater than left level 2 and submandibular lymph nodes are reactive. No necrotic nodes are present. Vascular: Normal Limited intracranial: Within normal limits. Visualized orbits: The globes and orbits are within normal limits. Mastoids and visualized paranasal sinuses: The paranasal sinuses and mastoid air cells are clear. Skeleton: Mild degenerative changes of the cervical spine are most pronounced at C5-6 with  uncovertebral spurring leading to left greater than right foraminal narrowing. No focal osseous lesions are present. Multiple dental caries are present. Large dental caries involve the right mandibular premolar and first molar with lucency surrounding the roots of both teeth. Prominent dental caries is present within the impacted right third mandibular molar with lucency surrounding the tooth root that extends to the lateral margin of the mandible. Multiple other dental caries are present bilaterally. Upper chest: Lung apices are clear. Thoracic inlet is within normal limits. Other: Ill-defined fluid collection in the right masticator space uplifts the masseter muscle. The low-density collection measures 5.3 x 5.1 x 2.1 cm. IMPRESSION: 1. 5.3 x 5.1 x 2.1 cm ill-defined fluid collection in the right masticator space uplifts the masseter muscle consistent with in absence secondary to adjacent dental disease. 2. Prominent dental caries involving the second premolar, first molar and third molar of the right mandible. The third molar is likely the etiology of the abscess. 3. Multiple other dental caries are present bilaterally. 4. Reactive adenopathy. 5. Mild degenerative changes of the cervical spine are most pronounced  at C5-6 with uncovertebral spurring leading to left greater than right foraminal narrowing. These results were called by telephone at the time of interpretation on 03/01/2022 at 3:15 pm to provider [facility] , who verbally acknowledged these results. Electronically Signed   By: Marin Roberts M.D.   On: 03/01/2022 15:15    Pending Labs Unresulted Labs (From admission, onward)     Start     Ordered   03/02/22 0500  CBC  Tomorrow morning,   R        03/01/22 1622   03/01/22 1654  Ferritin  Add-on,   AD        03/01/22 1655   03/01/22 1654  Iron and TIBC  Add-on,   AD        03/01/22 1655   03/01/22 1654  Vitamin B12  Add-on,   AD        03/01/22 1655   03/01/22 1654  Folate  Add-on,   AD        03/01/22 1655   03/01/22 1620  HIV Antibody (routine testing w rflx)  (HIV Antibody (Routine testing w reflex) panel)  Once,   R        03/01/22 1622            Vitals/Pain Today's Vitals   03/01/22 1100 03/01/22 1116  BP: (!) 151/94   Pulse: (!) 102   Resp: 16   Temp: 98 F (36.7 C)   TempSrc: Oral   SpO2: 100%   PainSc:  9     Isolation Precautions No active isolations  Medications Medications  cefTRIAXone (ROCEPHIN) 2 g in sodium chloride 0.9 % 100 mL IVPB (0 g Intravenous Stopped 03/01/22 1601)  metroNIDAZOLE (FLAGYL) IVPB 500 mg (0 mg Intravenous Stopped 03/01/22 1654)  acetaminophen (TYLENOL) tablet 650 mg (has no administration in time range)    Or  acetaminophen (TYLENOL) suppository 650 mg (has no administration in time range)  oxyCODONE (Oxy IR/ROXICODONE) immediate release tablet 5 mg (has no administration in time range)  ketorolac (TORADOL) 30 MG/ML injection 30 mg (30 mg Intravenous Given 03/01/22 1700)  ondansetron (ZOFRAN) tablet 4 mg (has no administration in time range)    Or  ondansetron (ZOFRAN) injection 4 mg (has no administration in time range)  morphine (PF) 4 MG/ML injection 4 mg (4 mg Intravenous Given 03/01/22 1303)  ondansetron (ZOFRAN)  injection 4 mg (  4 mg Intravenous Given 03/01/22 1302)  potassium chloride 10 mEq in 100 mL IVPB (0 mEq Intravenous Stopped 03/01/22 1702)  iohexol (OMNIPAQUE) 300 MG/ML solution 75 mL (75 mLs Intravenous Contrast Given 03/01/22 1452)    Mobility walks Low fall risk    R Recommendations: See Admitting Provider Note  Report given to: Angelito Nehemiah Settle RN  Additional Notes:

## 2022-03-01 NOTE — ED Triage Notes (Signed)
Patient here to have further work up for 2 weeks of facial swelling. Had labs last night, increased pain

## 2022-03-01 NOTE — Transfer of Care (Signed)
Immediate Anesthesia Transfer of Care Note  Patient: Joshua Hartman  Procedure(s) Performed: IRRIGATION AND DEBRIDEMENT FACIAL ABSCESS AND EXTRACTION OF TEETH #2,14,15,17,18,19,26,30,31,32 (Mouth)  Patient Location: PACU  Anesthesia Type:General  Level of Consciousness: awake, alert  and oriented  Airway & Oxygen Therapy: Patient Spontanous Breathing  Post-op Assessment: Report given to RN, Post -op Vital signs reviewed and stable and Patient moving all extremities X 4  Post vital signs: Reviewed and stable  Last Vitals:  Vitals Value Taken Time  BP 147/100 03/01/22 2330  Temp    Pulse 90 03/01/22 2332  Resp 17 03/01/22 2332  SpO2 99 % 03/01/22 2332  Vitals shown include unvalidated device data.  Last Pain:  Vitals:   03/01/22 2000  TempSrc: Oral  PainSc:          Complications: No notable events documented.

## 2022-03-01 NOTE — Anesthesia Procedure Notes (Addendum)
Procedure Name: Intubation Date/Time: 03/01/2022 9:37 PM  Performed by: Edmonia Caprio, CRNAPre-anesthesia Checklist: Patient being monitored, Suction available, Timeout performed, Emergency Drugs available and Patient identified Patient Re-evaluated:Patient Re-evaluated prior to induction Oxygen Delivery Method: Circle system utilized Preoxygenation: Pre-oxygenation with 100% oxygen Induction Type: IV induction and Rapid sequence Ventilation: Mask ventilation without difficulty Laryngoscope Size: Glidescope and 4 Grade View: Grade I Tube type: Oral Tube size: 7.0 (parker tube) mm Number of attempts: 1 Airway Equipment and Method: Rigid stylet and Video-laryngoscopy Placement Confirmation: ETT inserted through vocal cords under direct vision, positive ETCO2 and breath sounds checked- equal and bilateral Secured at: 24 cm Tube secured with: Tape Dental Injury: Teeth and Oropharynx as per pre-operative assessment  Comments: Difficult airway due to facial abcess and inability to open mouth .

## 2022-03-01 NOTE — ED Provider Notes (Signed)
[facility] EMERGENCY DEPARTMENT Provider Note   CSN: [id number] Arrival date & time: 03/01/22  1051     History  No chief complaint on file.   Joshua Hartman is a 31 y.o. male with a history of asthma and recent dental infection.  Presents emergency department with facial swelling and trismus.  Patient reports that he was seen earlier this month at urgent care and diagnosed with a dental infection.  Patient was prescribed a 7-day course of clindamycin which she reports taking.  Patient states that he has had progressively worsening swelling to the right side of his face as well as new development of trismus.  Patient complains of dental pain and pain to the right side of his face.  Patient has had difficulty eating due to his trismus however has been hydrating without difficulty.  Patient reports that he has had no dental injuries for multiple months prior to developing this dental infection.  Denies any fever, chills, trouble swallowing, trouble breathing, neck pain, neck stiffness, drooling, hot potato voice.  Denies any alcohol use.  Endorses marijuana use.  HPI     Home Medications Prior to Admission medications   Medication Sig Start Date End Date Taking? Authorizing Provider  fluticasone (FLONASE) 50 MCG/ACT nasal spray Place 1-2 sprays into both nostrils daily. 01/11/21   Wieters, Hallie C, PA-C  ibuprofen (ADVIL) 600 MG tablet Take 1 tablet (600 mg total) by mouth every 6 (six) hours as needed for mild pain or moderate pain. 02/19/22   Gustavus Bryant, FNP  ondansetron (ZOFRAN ODT) 4 MG disintegrating tablet 4mg  ODT q4 hours prn nausea/vomit 08/26/16   14/12/17, MD      Allergies    Augmentin [amoxicillin-pot clavulanate]    Review of Systems   Review of Systems  Constitutional:  Negative for chills and fever.  HENT:  Positive for dental problem and facial swelling. Negative for drooling, trouble swallowing and voice change.   Eyes:  Negative for  visual disturbance.  Gastrointestinal:  Negative for nausea and vomiting.  Musculoskeletal:  Negative for neck pain and neck stiffness.    Physical Exam Updated Vital Signs BP (!) 151/94 (BP Location: Right Arm)   Pulse (!) 102   Temp 98 F (36.7 C) (Oral)   Resp 16   SpO2 100%  Physical Exam Vitals and nursing note reviewed.  Constitutional:      General: He is not in acute distress.    Appearance: He is not ill-appearing, toxic-appearing or diaphoretic.     Comments: Patient swelling over right buccal area.  Swelling is firm, and tender with no fluctuance noted.  HENT:     Head: Normocephalic.     Mouth/Throat:     Lips: Pink. No lesions.     Mouth: Mucous membranes are moist. No angioedema.     Dentition: Abnormal dentition. Dental tenderness present.     Pharynx: Uvula midline.     Comments: Unable to clearly evaluate patient's oropharynx due to trismus.  Uvula does appear midline.  Patient has poor dentition with multiple missing teeth and dental caries.  Dental tenderness to back right top molars.  Handling all secretions without difficulty. Eyes:     General: No scleral icterus.       Right eye: No discharge.        Left eye: No discharge.  Cardiovascular:     Rate and Rhythm: Normal rate.  Pulmonary:     Effort: Pulmonary effort is normal. No tachypnea,  bradypnea or respiratory distress.     Breath sounds: Normal breath sounds. No stridor.  Musculoskeletal:     Cervical back: Full passive range of motion without pain, normal range of motion and neck supple. No edema, erythema, signs of trauma, rigidity, torticollis or crepitus. No pain with movement, spinous process tenderness or muscular tenderness. Normal range of motion.  Lymphadenopathy:     Cervical: No cervical adenopathy.  Skin:    General: Skin is warm and dry.  Neurological:     General: No focal deficit present.     Mental Status: He is alert.  Psychiatric:        Behavior: Behavior is cooperative.      ED Results / Procedures / Treatments   Labs (all labs ordered are listed, but only abnormal results are displayed) Labs Reviewed  COMPREHENSIVE METABOLIC PANEL - Abnormal; Notable for the following components:      Result Value   Potassium 3.0 (*)    Albumin 3.0 (*)    AST 14 (*)    All other components within normal limits  CBC WITH DIFFERENTIAL/PLATELET - Abnormal; Notable for the following components:   WBC 15.1 (*)    RBC 3.33 (*)    Hemoglobin 10.6 (*)    HCT 31.5 (*)    Platelets 542 (*)    Neutro Abs 12.7 (*)    Abs Immature Granulocytes 0.09 (*)    All other components within normal limits  LACTIC ACID, PLASMA    EKG None  Radiology CT Soft Tissue Neck W Contrast  Result Date: 03/01/2022 CLINICAL DATA:  Soft tissue swelling, infection suspected. EXAM: CT NECK WITH CONTRAST TECHNIQUE: Multidetector CT imaging of the neck was performed using the standard protocol following the bolus administration of intravenous contrast. RADIATION DOSE REDUCTION: This exam was performed according to the departmental dose-optimization program which includes automated exposure control, adjustment of the mA and/or kV according to patient size and/or use of iterative reconstruction technique. CONTRAST:  27mL OMNIPAQUE IOHEXOL 300 MG/ML  SOLN COMPARISON:  None Available. FINDINGS: Pharynx and larynx: No focal mucosal or submucosal lesions are present. Nasopharynx is clear. Soft palate and tongue base are within normal limits. Vallecula and epiglottis are within normal limits. Aryepiglottic folds and piriform sinuses are clear. Vocal cords are midline and symmetric. Trachea is clear. Salivary glands: Marked edema surrounds the right sub mandibular gland. Submandibular glands and ducts are otherwise within normal limits. Mass effect noted on the right parotid gland. The parotid glands are otherwise normal. Thyroid: Normal thyroid Lymph nodes: Enlarged right greater than left level 2 and  submandibular lymph nodes are reactive. No necrotic nodes are present. Vascular: Normal Limited intracranial: Within normal limits. Visualized orbits: The globes and orbits are within normal limits. Mastoids and visualized paranasal sinuses: The paranasal sinuses and mastoid air cells are clear. Skeleton: Mild degenerative changes of the cervical spine are most pronounced at C5-6 with uncovertebral spurring leading to left greater than right foraminal narrowing. No focal osseous lesions are present. Multiple dental caries are present. Large dental caries involve the right mandibular premolar and first molar with lucency surrounding the roots of both teeth. Prominent dental caries is present within the impacted right third mandibular molar with lucency surrounding the tooth root that extends to the lateral margin of the mandible. Multiple other dental caries are present bilaterally. Upper chest: Lung apices are clear. Thoracic inlet is within normal limits. Other: Ill-defined fluid collection in the right masticator space uplifts the masseter muscle. The  low-density collection measures 5.3 x 5.1 x 2.1 cm. IMPRESSION: 1. 5.3 x 5.1 x 2.1 cm ill-defined fluid collection in the right masticator space uplifts the masseter muscle consistent with in absence secondary to adjacent dental disease. 2. Prominent dental caries involving the second premolar, first molar and third molar of the right mandible. The third molar is likely the etiology of the abscess. 3. Multiple other dental caries are present bilaterally. 4. Reactive adenopathy. 5. Mild degenerative changes of the cervical spine are most pronounced at C5-6 with uncovertebral spurring leading to left greater than right foraminal narrowing. These results were called by telephone at the time of interpretation on 03/01/2022 at 3:15 pm to provider [facility] , who verbally acknowledged these results. Electronically Signed   By: Marin Roberts M.D.   On:  03/01/2022 15:15    Procedures Procedures    Medications Ordered in ED Medications  cefTRIAXone (ROCEPHIN) 2 g in sodium chloride 0.9 % 100 mL IVPB (2 g Intravenous New Bag/Given 03/01/22 1307)  metroNIDAZOLE (FLAGYL) IVPB 500 mg (500 mg Intravenous New Bag/Given 03/01/22 1523)  potassium chloride 10 mEq in 100 mL IVPB (10 mEq Intravenous New Bag/Given 03/01/22 1529)  morphine (PF) 4 MG/ML injection 4 mg (4 mg Intravenous Given 03/01/22 1303)  ondansetron (ZOFRAN) injection 4 mg (4 mg Intravenous Given 03/01/22 1302)  iohexol (OMNIPAQUE) 300 MG/ML solution 75 mL (75 mLs Intravenous Contrast Given 03/01/22 1452)    ED Course/ Medical Decision Making/ A&P Clinical Course as of 03/01/22 1545  Sat Mar 01, 2022  1513 I was contacted by radiology who reports that patient has large masticator space abscess.  Abscess is approximately 5 cm in size.  Origin appears to be third molar wisdom tooth impaction and dental caries. [PB]    Clinical Course User Index [PB] Haskel Schroeder, PA-C                           Medical Decision Making Amount and/or Complexity of Data Reviewed Labs: ordered. Radiology: ordered.  Risk Prescription drug management. Decision regarding hospitalization.   Alert 31 year old male in no acute distress, nontoxic-appearing.  Presents to the ED with a chief complaint of dental pain and facial swelling.  Information is obtained from patient and patient's girlfriend at bedside.  I reviewed patient's past medical records including notes from outpatient providers and labs.  Per chart review patient was seen at urgent care on 6/11 and noted to have moderate gingival swelling and erythema to the right upper and lower dentition.  No obvious abscess or facial swelling was noted.  Patient was started on 7-day course of clindamycin.  Patient returned to urgent care on 6/15 due to worsening swelling and no resolution of his dental pain.  Patient was sent to the emergency  department for further evaluation.  Patient was seen in triage and lab testing was obtained.  Patient eloped prior to being seen by a provider in the emergency department.  Labs from 6/16 showed leukocytosis with white count of 15.5  Patient has significant facial swelling which is likely secondary to odontogenic infection.  Will obtain CT soft tissue neck with contrast to evaluate for involvement of deep space neck infection or abscess formation.  We will recheck lab work as well as obtain lactic acid.  We will start patient on ceftriaxone and Flagyl.  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  I spoke with pharmacist in the emergency department prior to ordering ceftriaxone for the patient.  I personally viewed and interpreted patient's lab results.  Pertinent findings include: -Lactic acid within normal limits -Leukocytosis at 15.1 -Hemoglobin 10.6, stable from lab results obtained yesterday -Potassium 3.0 decreased on 3.2 yesterday  I personally viewed and interpreted patient's CT imaging.  Agree with radiology interpretation of masticator space abscess.  I spoke with on-call oral surgeon Dr. Ross Marcus who advised that patient will need extraction and abscess drained.  Plan for surgery tonight or tomorrow morning.  Recommended starting patient on Unasyn however due to patient's allergies is okay with keeping patient on ceftriaxone and Flagyl.  Requested orthopantogram being ordered.  We will consult hospitalist team for admission.  I spoke with Loistine Chance from the internal medicine team who advised internal medicine team will see the patient for admission.          Final Clinical Impression(s) / ED Diagnoses Final diagnoses:  Abscess of masticator space of mouth    Rx / DC Orders ED Discharge Orders     None         Berneice Heinrich 03/01/22  1558    Arby Barrette, MD 03/14/22 1149

## 2022-03-02 ENCOUNTER — Other Ambulatory Visit: Payer: Self-pay

## 2022-03-02 ENCOUNTER — Encounter (HOSPITAL_COMMUNITY): Payer: Self-pay | Admitting: Oral Surgery

## 2022-03-02 LAB — CBC
HCT: 29.5 % — ABNORMAL LOW (ref 39.0–52.0)
Hemoglobin: 10.4 g/dL — ABNORMAL LOW (ref 13.0–17.0)
MCH: 32.4 pg (ref 26.0–34.0)
MCHC: 35.3 g/dL (ref 30.0–36.0)
MCV: 91.9 fL (ref 80.0–100.0)
Platelets: 515 10*3/uL — ABNORMAL HIGH (ref 150–400)
RBC: 3.21 MIL/uL — ABNORMAL LOW (ref 4.22–5.81)
RDW: 12.3 % (ref 11.5–15.5)
WBC: 27.4 10*3/uL — ABNORMAL HIGH (ref 4.0–10.5)
nRBC: 0 % (ref 0.0–0.2)

## 2022-03-02 LAB — IRON AND TIBC
Iron: 31 ug/dL — ABNORMAL LOW (ref 45–182)
Saturation Ratios: 14 % — ABNORMAL LOW (ref 17.9–39.5)
TIBC: 225 ug/dL — ABNORMAL LOW (ref 250–450)
UIBC: 194 ug/dL

## 2022-03-02 LAB — FOLATE: Folate: 9.2 ng/mL (ref >5.9)

## 2022-03-02 LAB — FERRITIN: Ferritin: 135 ng/mL (ref 24–336)

## 2022-03-02 LAB — HIV ANTIBODY (ROUTINE TESTING W REFLEX): HIV Screen 4th Generation wRfx: NONREACTIVE

## 2022-03-02 LAB — VITAMIN B12: Vitamin B-12: 490 pg/mL (ref 180–914)

## 2022-03-02 MED ORDER — ACETAMINOPHEN 500 MG PO TABS: 1000.0000 mg | ORAL_TABLET | Freq: Three times a day (TID) | ORAL | 0 refills | Status: AC | PRN

## 2022-03-02 MED ORDER — NAPROXEN 500 MG PO TABS: 500.0000 mg | ORAL_TABLET | Freq: Two times a day (BID) | ORAL | 0 refills | Status: AC | PRN

## 2022-03-02 MED ORDER — METRONIDAZOLE 500 MG PO TABS: 500.0000 mg | ORAL_TABLET | Freq: Two times a day (BID) | ORAL | 0 refills | Status: AC

## 2022-03-02 MED ORDER — CEFDINIR 300 MG PO CAPS: 300.0000 mg | ORAL_CAPSULE | Freq: Two times a day (BID) | ORAL | 0 refills | Status: AC

## 2022-03-02 MED ORDER — FERROUS SULFATE 325 (65 FE) MG PO TABS: 325.0000 mg | ORAL_TABLET | ORAL | 0 refills | Status: AC

## 2022-03-02 NOTE — Progress Notes (Signed)
Pacu RN Report to floor given  Gave report to Minerva Areola, Consulting civil engineer. Room: 5N19    Discussed surgery, meds given in OR and Pacu, VS, IV fluids given, EBL, urine output, pain and other pertinent information. Also discussed if pt had any family or friends here or belongings with them.   Discussed 10 teeth removed, dressing in  place w/ penrose drain, no output.   Pt exits my care.

## 2022-03-02 NOTE — Discharge Summary (Signed)
Name: Joshua Hartman MRN: [id number] DOB: [birth year] 31 y.o. PCP: Patient, No Pcp Per  Date of Admission: 03/01/2022 10:58 AM Date of Discharge: 03/02/2022 Attending Physician: Dickie La, MD  Discharge Diagnosis: 1. Abscess of R masticator space 2. Multiple dental caries 3. Chronic normocytic anemia 4. Iron deficiency 5. Hypokalemia 6. Tobacco use disorder 7. Polysubstance use disorder  Discharge Medications: Allergies as of 03/02/2022       Reactions   Augmentin [amoxicillin-pot Clavulanate]    Unknown        Medication List     STOP taking these medications    fluticasone 50 MCG/ACT nasal spray Commonly known as: FLONASE   ibuprofen 200 MG tablet Commonly known as: ADVIL   ibuprofen 600 MG tablet Commonly known as: ADVIL   ondansetron 4 MG disintegrating tablet Commonly known as: Zofran ODT       TAKE these medications    acetaminophen 500 MG tablet Commonly known as: TYLENOL Take 2 tablets (1,000 mg total) by mouth every 8 (eight) hours as needed for up to 7 days.   cefdinir 300 MG capsule Commonly known as: OMNICEF Take 1 capsule (300 mg total) by mouth 2 (two) times daily for 7 days.   ferrous sulfate 325 (65 FE) MG tablet Take 1 tablet (325 mg total) by mouth every other day.   metroNIDAZOLE 500 MG tablet Commonly known as: Flagyl Take 1 tablet (500 mg total) by mouth 2 (two) times daily for 7 days.   naproxen 500 MG tablet Commonly known as: Naprosyn Take 1 tablet (500 mg total) by mouth 2 (two) times daily as needed for up to 5 days.               Discharge Care Instructions  (From admission, onward)           Start     Ordered   03/02/22 0000  Leave dressing on - Keep it clean, dry, and intact until clinic visit        03/02/22 1030            Disposition and follow-up:   Joshua Hartman was discharged from [facility] in Stable condition.  At the hospital follow up visit please address:  1. Abscess  of R masticator space - Had I&D and removal of 7 teeth 6/17. D/C'd on cefdinir, flagyl x7d. Please make sure compliance with abx. Should have appt w/ OMFS 6/20 to remove drains.  2. Multiple dental caries - Needs to establish with dentist in area, will need assistance in getting this  3. Chronic normocytic anemia/Iron deficiency: Unclear cause, found to have iron deficiency. Should be taking oral Fe every other day. Likely will need GI follow-up and further work-up  4. Hypokalemia: Likely 2/2 poor PO intake, re-check to make sure normal  5. Tobacco use disorder: Continues to smoke daily, discuss cessation  6. Polysubstance use disorder: Smokes marijuana daily, discuss cessation  7.  Labs / imaging needed at time of follow-up: CBC, BMP  8.  Pending labs/ test needing follow-up: wound cultures  Follow-up Appointments:  Follow-up Information     Enis Slipper, DMD. Schedule an appointment as soon as possible for a visit on 03/04/2022.   Specialty: Oral Surgery Contact information: [address] [place] [address] [phone]         [facility]. Schedule an appointment as soon as possible for a visit.   Why: Our clinic will call you tomorrow,  6/19 to make an appointment. Contact information: [address] [place] [address] [phone]                Hospital Course by problem list:  #Abscess of R masticator space #Multiple dental caries Patient arrived to [facility] after two weeks of right-sided facial and dental pain with failure of outpatient management. He was afebrile and hemodynamically stable on arrival. Initial lab work revealed leukocytosis, normocytic anemia, and thrombocytosis. CT soft tissue neck revealed large abscess in masticator space. OMFS was consulted and IMTS was consulted for admission. Patient was placed on ceftriaxone and metronidazole given previous allergy with penicillins. Dr. Ross Marcus  with OMFS completed incision and drainage of masticator space along with removal of seven teeth on the evening of 6/17. The next morning, Joshua Hartman had his pain well-controlled and continued to be hemodynamically stable. He was discharged 6/18 with plans to follow-up with Dr. Ross Marcus on Tuesday for drain removal and will follow-[facility] with [facility] for PCP needs. Patient will need to be set up with a dentist given his multiple dental caries. Discharged on seven days of cefdinir and flagyl.  #Chronic normocytic anemia #Iron deficiency Throughout patient's hospitalization, patient's hemoglobin remained stable, although low 10-11. Upon work-up, found to have an iron deficiency for which oral iron tablets were started. He does mention some previous lower GI bleeding with hemorrhoids, but says this is rare and denies any melena. Will have him follow-up with [facility], could use referral to GI for further evaluation.   #Hypokalemia Has had decreased PO intake prior to arrival given significant pain and swelling on the right side of his mouth. Expect K to improve once able to eat more.   #Tobacco use disorder #Polysubstance use disorder Patient is a current daily tobacco and marijuana smoker. Will need continued conversations regarding cessation.   Discharge Exam:   BP 110/63 (BP Location: Right Arm)   Pulse 84   Temp 98 F (36.7 C)   Resp 18   SpO2 100%  Discharge exam:  General: Resting comfortably in no acute distress HENT: Bandage wrapped around head, mouth difficult to open CV: Regular rate, rhythm. No murmurs appreciated. Warm extremities. Pulm: Normal work of breathing on room air. Clear to ausculation bilaterally. GI: Abdomen soft, non-tender, non-distended. Normoactive bowel sounds. MSK: Normal bulk, tone. No lower extremity edema appreciated. Neuro: Awake, alert, conversing appropriately. Grossly non-focal Psych: Normal mood, affect, speech.  Pertinent Labs, Studies, and  Procedures:     Latest Ref Rng & Units 03/02/2022    1:34 AM 03/01/2022   12:20 PM 02/28/2022    8:10 PM  CBC  WBC 4.0 - 10.5 K/uL 27.4  15.1  15.5   Hemoglobin 13.0 - 17.0 g/dL 57.3  22.0  25.4   Hematocrit 39.0 - 52.0 % 29.5  31.5  32.3   Platelets 150 - 400 K/uL 515  542  599       Latest Ref Rng & Units 03/01/2022   12:20 PM 02/28/2022    8:10 PM 08/26/2016    1:31 PM  BMP  Glucose 70 - 99 mg/dL 87  270  95   BUN 6 - 20 mg/dL 13  11  16    Creatinine 0.61 - 1.24 mg/dL  6.23  7.62   Sodium 135 - 145 mmol/L 137  138  141   Potassium 3.5 - 5.1 mmol/L 3.0  3.2  3.8   Chloride 98 - 111 mmol/L 100  101  106   CO2 22 - 32 mmol/L 25  24  25    Calcium 8.9 - 10.3 mg/dL 9.2  9.3  8.0    CT soft tissue neck w/ contrast: -5.3 x 5.1 x 2.1 cm ill-defined fluid collection in the right masticator space uplifts the masseter muscle consistent with in absence secondary to adjacent dental disease. -Prominent dental caries involving the second premolar, first molar and third molar of the right mandible. The third molar is likely the etiology of the abscess. -Multiple other dental caries are present bilaterally. -Reactive adenopathy. -Mild degenerative changes of the cervical spine are most pronounced at C5-6 with uncovertebral spurring leading to left greater than right foraminal narrowing.  Discharge Instructions: Discharge Instructions     Call MD for:  difficulty breathing, headache or visual disturbances   Complete by: As directed    Call MD for:  extreme fatigue   Complete by: As directed    Call MD for:  hives   Complete by: As directed    Call MD for:  persistant dizziness or light-headedness   Complete by: As directed    Call MD for:  persistant nausea and vomiting   Complete by: As directed    Call MD for:  redness, tenderness, or signs of infection (pain, swelling, redness, odor or green/yellow discharge around incision site)   Complete by: As directed    Call MD for:  severe  uncontrolled pain   Complete by: As directed    Call MD for:  temperature >100.4   Complete by: As directed    Diet - low sodium heart healthy   Complete by: As directed    Discharge instructions   Complete by: As directed    Joshua Hartman, I am glad you are feeling better and can be discharged today! You were admitted due to an abscess and had this drained with Dr. Tommie Ard on March 01, 2022. Please see the following notes:  - For your pain, I have prescribed five days of naproxen and Tylenol. I would alternate between the two of these for pain control. Do NOT take ibuprofen with these medications. Naproxen is a similar medicine, and taking too much of this type of medication can cause a stomach ulcer and kidney problems.  - We would like for you to continue with one week of antibiotics. You should take both of these twice daily for the next seven days. These are listed below: Metronidazole 500mg  twice daily for 7 days Cefdinir 300mg  twice daily for 7 days  - It will be important for you to keep your follow-up appointments (below). We did find that your iron was low, so I am going to start you on oral iron tablets (ferrous sulfate 325mg  every other day). In addition, your overall blood counts were low, which is unusual for a healthy person of your age. We would like to look more into this when you come to our clinic.   - You will be going home with the drains placed in the space. You will need to follow-up with Dr. March 03, 2022 this week. He would prefer to see you on Tuesday, June 20th. His office is called [facility]. The number to call to make an appointment is [phone]. Please call tomorrow, Monday 6/19 to make this appointment.  - In addition, we would like for you to follow-up with our clinic downstairs on the ground floor of the hospital. We can see you for a hospital follow-up appointment in  the next 1-2 weeks. If you would like to, we can continue seeing you  afterward to be your primary care physician. The clinic is located on the ground floor of the hospital (same floor as the cafeteria). The number of the clinic is [phone]. I have sent a message to our front desk, they will call you tomorrow to set up this appointment.  It was a pleasure meeting you, Joshua Hartman. I wish you the best and hope you stay happy and healthy!  Happy Father's Day!  Thank you, Evlyn Kanner, MD   Increase activity slowly   Complete by: As directed    Leave dressing on - Keep it clean, dry, and intact until clinic visit   Complete by: As directed        Signed: Evlyn Kanner, MD 03/02/2022, 10:31 AM   Pager: [phone]

## 2022-03-02 NOTE — Progress Notes (Signed)
Discharge summary has been provided by primary nurse. Pt d/c to home as ordered. No complaints. Pt's spouse is responsible for pt's transportation.

## 2022-03-02 NOTE — Plan of Care (Signed)

## 2022-03-02 NOTE — Anesthesia Postprocedure Evaluation (Signed)
Anesthesia Post Note  Patient: Joshua Hartman  Procedure(s) Performed: IRRIGATION AND DEBRIDEMENT FACIAL ABSCESS AND EXTRACTION OF TEETH [phone] (Mouth)     Patient location during evaluation: PACU Anesthesia Type: General Level of consciousness: awake and alert, patient cooperative and oriented Pain management: pain level controlled Vital Signs Assessment: post-procedure vital signs reviewed and stable Respiratory status: spontaneous breathing, nonlabored ventilation and respiratory function stable Cardiovascular status: blood pressure returned to baseline and stable Postop Assessment: no apparent nausea or vomiting and adequate PO intake Anesthetic complications: no   No notable events documented.  Last Vitals:  Vitals:   03/02/22 0015 03/02/22 0020  BP: (!) 142/94   Pulse: 85 87  Resp: 15 15  Temp: 36.7 C   SpO2: 100% 100%    Last Pain:  Vitals:   03/02/22 0015  TempSrc:   PainSc: 3                  [name],E. Delaina Fetsch

## 2022-03-02 NOTE — TOC Transition Note (Signed)
Transition of Care [facility]) - CM/SW Discharge Note   Patient Details  Name: Joshua Hartman MRN: [id number] Date of Birth: [birth year]  Transition of Care [facility] CM/SW Contact:  Bess Kinds, RN Phone Number: [phone] 03/02/2022, 11:11 AM   Clinical Narrative:     Patient to transition home today. No PCP listed. Noted patient to f/u with [facility]. No TOC needs identified at this time.   Final next level of care: Home/Self Care Barriers to Discharge: No Barriers Identified   Patient Goals and CMS Choice        Discharge Placement                       Discharge Plan and Services                                     Social Determinants of Health (SDOH) Interventions     Readmission Risk Interventions     No data to display

## 2022-03-04 LAB — AEROBIC/ANAEROBIC CULTURE W GRAM STAIN (SURGICAL/DEEP WOUND)
Culture: NORMAL
Gram Stain: NONE SEEN

## 2022-03-06 ENCOUNTER — Encounter: Payer: Self-pay | Admitting: Student

## 2022-03-19 ENCOUNTER — Encounter: Payer: Self-pay | Admitting: Internal Medicine

## 2022-03-19 NOTE — Progress Notes (Deleted)
HFU Abscess of masticator space Finished medications? OMFS 6/20 to remove drains  Dentist in area- uninsured  Iron deficiency anemia- Iron supplement, GI referral after   Hypokalemia  Tobacoo use

## 2022-03-26 ENCOUNTER — Encounter: Payer: Self-pay | Admitting: Internal Medicine
